# Patient Record
Sex: Male | Born: 1960 | Race: White | Hispanic: No | Marital: Married | Smoking: Never smoker
Health system: Southern US, Community
[De-identification: ages and names within clinical notes are randomized; demographics above are authoritative.]

## PROBLEM LIST (undated history)

## (undated) DIAGNOSIS — E785 Hyperlipidemia, unspecified: Secondary | ICD-10-CM

## (undated) DIAGNOSIS — R000 Tachycardia, unspecified: Secondary | ICD-10-CM

## (undated) DIAGNOSIS — J309 Allergic rhinitis, unspecified: Secondary | ICD-10-CM

## (undated) DIAGNOSIS — K635 Polyp of colon: Secondary | ICD-10-CM

## (undated) DIAGNOSIS — B009 Herpesviral infection, unspecified: Secondary | ICD-10-CM

## (undated) HISTORY — DX: Hyperlipidemia, unspecified: E78.5

## (undated) HISTORY — PX: HERNIA REPAIR: SHX51

## (undated) HISTORY — DX: Tachycardia, unspecified: R00.0

## (undated) HISTORY — PX: SPINE SURGERY: SHX786

## (undated) HISTORY — PX: COLONOSCOPY: SHX174

## (undated) HISTORY — DX: Polyp of colon: K63.5

## (undated) HISTORY — DX: Allergic rhinitis, unspecified: J30.9

## (undated) HISTORY — DX: Herpesviral infection, unspecified: B00.9

---

## 2003-12-09 ENCOUNTER — Ambulatory Visit: Payer: Self-pay | Admitting: Internal Medicine

## 2004-11-26 ENCOUNTER — Ambulatory Visit: Payer: Self-pay | Admitting: Internal Medicine

## 2005-01-03 ENCOUNTER — Ambulatory Visit: Payer: Self-pay | Admitting: Internal Medicine

## 2005-02-01 ENCOUNTER — Ambulatory Visit (HOSPITAL_COMMUNITY): Admission: RE | Admit: 2005-02-01 | Discharge: 2005-02-01 | Payer: Self-pay | Admitting: Surgery

## 2005-02-11 ENCOUNTER — Ambulatory Visit: Payer: Self-pay | Admitting: Internal Medicine

## 2005-12-15 ENCOUNTER — Emergency Department (HOSPITAL_COMMUNITY): Admission: EM | Admit: 2005-12-15 | Discharge: 2005-12-15 | Payer: Self-pay | Admitting: Emergency Medicine

## 2006-02-21 ENCOUNTER — Ambulatory Visit: Payer: Self-pay | Admitting: Internal Medicine

## 2006-04-21 ENCOUNTER — Ambulatory Visit: Payer: Self-pay | Admitting: Internal Medicine

## 2007-03-02 ENCOUNTER — Encounter: Payer: Self-pay | Admitting: Internal Medicine

## 2007-04-22 ENCOUNTER — Encounter: Admission: RE | Admit: 2007-04-22 | Discharge: 2007-04-22 | Payer: Self-pay | Admitting: Internal Medicine

## 2007-04-22 ENCOUNTER — Ambulatory Visit: Payer: Self-pay | Admitting: Internal Medicine

## 2007-04-24 ENCOUNTER — Telehealth (INDEPENDENT_AMBULATORY_CARE_PROVIDER_SITE_OTHER): Payer: Self-pay | Admitting: *Deleted

## 2007-04-28 ENCOUNTER — Encounter: Payer: Self-pay | Admitting: Internal Medicine

## 2007-05-11 ENCOUNTER — Ambulatory Visit (HOSPITAL_COMMUNITY): Admission: RE | Admit: 2007-05-11 | Discharge: 2007-05-12 | Payer: Self-pay | Admitting: Neurosurgery

## 2007-05-14 ENCOUNTER — Encounter: Payer: Self-pay | Admitting: Internal Medicine

## 2007-06-01 ENCOUNTER — Encounter: Payer: Self-pay | Admitting: Internal Medicine

## 2007-07-13 ENCOUNTER — Encounter: Payer: Self-pay | Admitting: Internal Medicine

## 2007-10-13 ENCOUNTER — Encounter: Payer: Self-pay | Admitting: Internal Medicine

## 2007-11-04 ENCOUNTER — Encounter (INDEPENDENT_AMBULATORY_CARE_PROVIDER_SITE_OTHER): Payer: Self-pay

## 2008-01-04 ENCOUNTER — Telehealth: Payer: Self-pay | Admitting: Internal Medicine

## 2008-02-08 ENCOUNTER — Ambulatory Visit: Payer: Self-pay | Admitting: Internal Medicine

## 2008-02-15 ENCOUNTER — Ambulatory Visit: Payer: Self-pay | Admitting: Internal Medicine

## 2008-07-19 ENCOUNTER — Ambulatory Visit: Payer: Self-pay | Admitting: Internal Medicine

## 2008-09-22 ENCOUNTER — Ambulatory Visit: Payer: Self-pay | Admitting: Internal Medicine

## 2008-10-13 ENCOUNTER — Telehealth: Payer: Self-pay | Admitting: Internal Medicine

## 2009-01-26 ENCOUNTER — Ambulatory Visit: Payer: Self-pay | Admitting: Internal Medicine

## 2009-01-26 LAB — CONVERTED CEMR LAB
Albumin: 4.3 g/dL (ref 3.5–5.2)
Alkaline Phosphatase: 57 units/L (ref 39–117)
BUN: 12 mg/dL (ref 6–23)
Basophils Absolute: 0 10*3/uL (ref 0.0–0.1)
CO2: 28 meq/L (ref 19–32)
Calcium: 9.5 mg/dL (ref 8.4–10.5)
Cholesterol: 194 mg/dL (ref 0–200)
Creatinine, Ser: 1 mg/dL (ref 0.4–1.5)
Eosinophils Absolute: 0.1 10*3/uL (ref 0.0–0.7)
GFR calc non Af Amer: 84.27 mL/min (ref >60)
Glucose, Bld: 102 mg/dL — ABNORMAL HIGH (ref 70–99)
HDL: 43.7 mg/dL (ref >39.00)
Hemoglobin: 15.4 g/dL (ref 13.0–17.0)
Lymphocytes Relative: 13.3 % (ref 12.0–46.0)
MCHC: 32.9 g/dL (ref 30.0–36.0)
Monocytes Relative: 7.6 % (ref 3.0–12.0)
Neutro Abs: 4.9 10*3/uL (ref 1.4–7.7)
Platelets: 262 10*3/uL (ref 150.0–400.0)
RDW: 11.9 % (ref 11.5–14.6)
Sodium: 142 meq/L (ref 135–145)
TSH: 1.52 microintl units/mL (ref 0.35–5.50)
Total Bilirubin: 0.9 mg/dL (ref 0.3–1.2)
Triglycerides: 79 mg/dL (ref 0.0–149.0)
VLDL: 15.8 mg/dL (ref 0.0–40.0)

## 2009-01-30 LAB — CONVERTED CEMR LAB

## 2009-02-10 IMAGING — CR DG LUMBAR SPINE 2-3V
1 series · 1 of 1 positions shown · non-contrast
Comparison: MRI 04/22/2007

CLINICAL DATA: Herniated disc.  L3-L4 lumbar laminotomy and
microdiskectomy.

LUMBAR SPINE - 2-3 VIEW

[view not recorded]
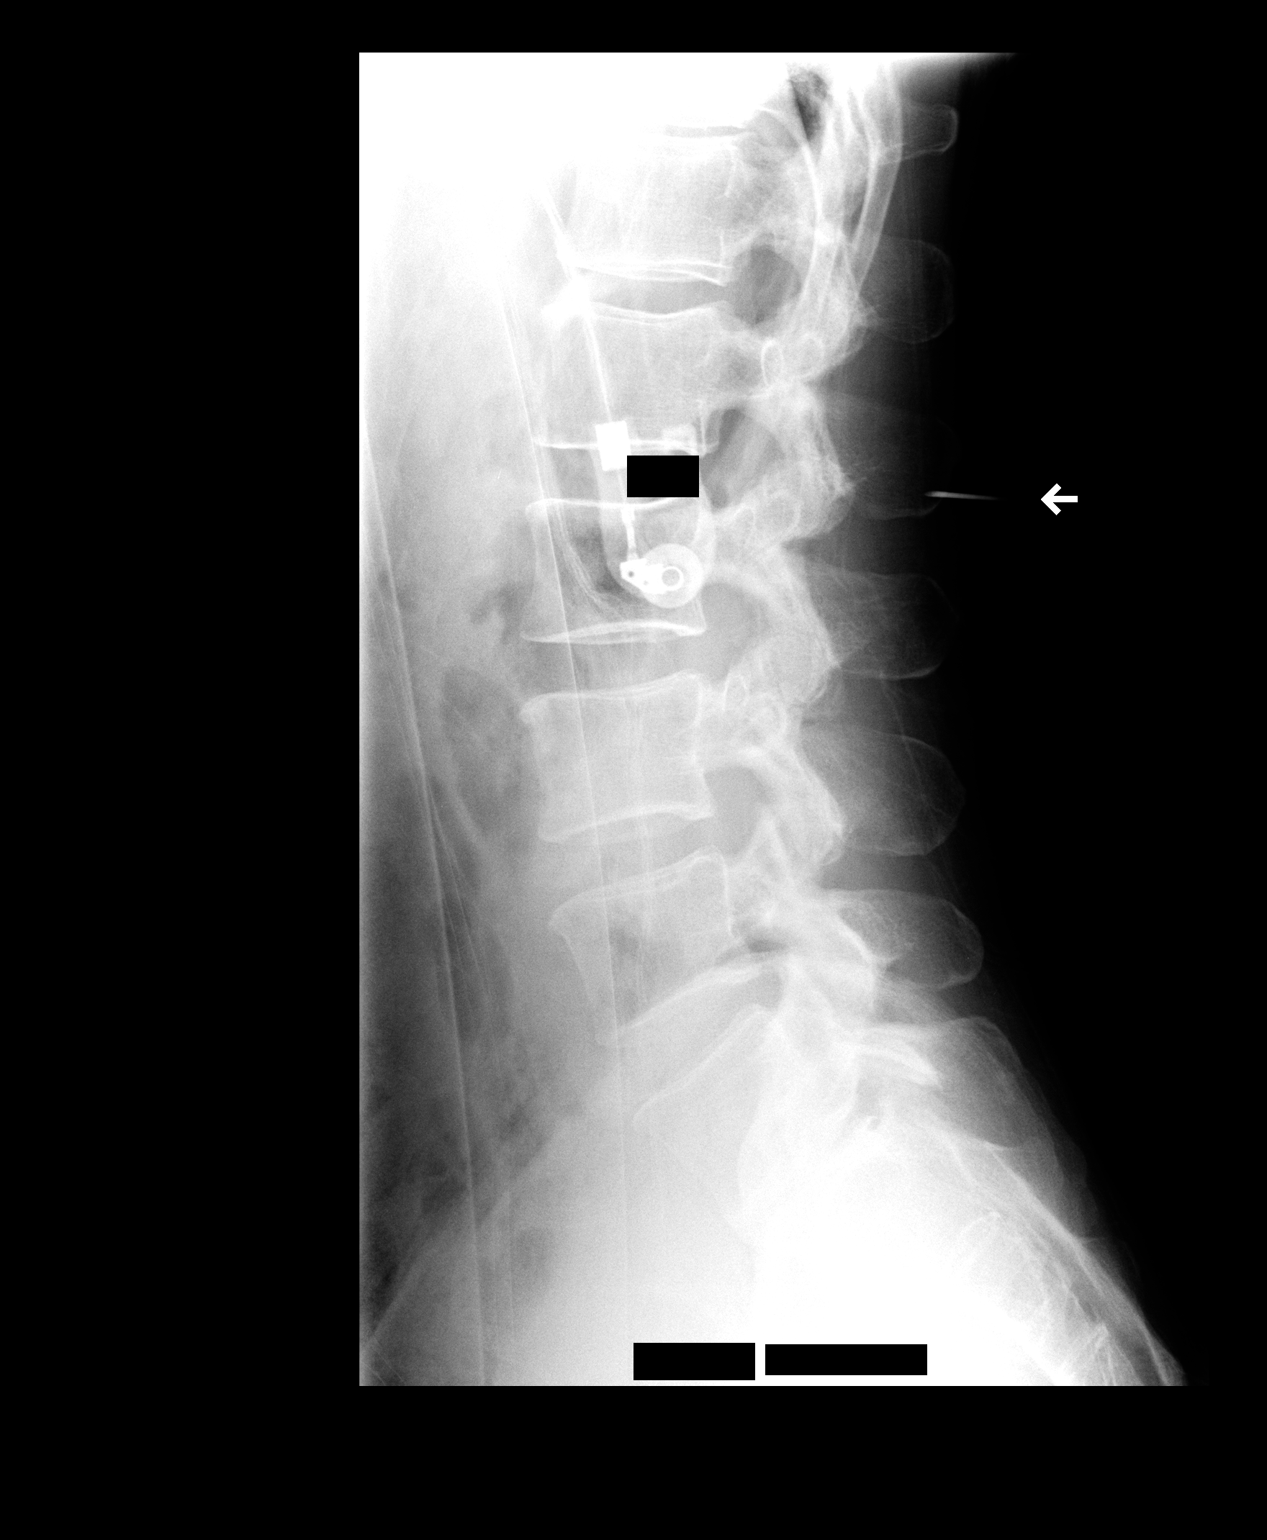

[1 of 1 positions shown; findings below may reference images not displayed]

FINDINGS: Labeling is based on the MRI from 04/22/2007.  There is a
transitional type vertebral body, as demonstrated on that exam.
Image labeled #1 demonstrates a surgical instrument posterior to
the L2-L3.  Image labeled #2 demonstrates a posterior retractor and
surgical instrument posterior to L4.
IMPRESSION: Intraoperative localization.

## 2009-02-21 ENCOUNTER — Ambulatory Visit: Payer: Self-pay | Admitting: Internal Medicine

## 2009-02-21 LAB — CONVERTED CEMR LAB: PSA: 0.41 ng/mL (ref 0.10–4.00)

## 2009-02-24 ENCOUNTER — Ambulatory Visit: Payer: Self-pay | Admitting: Internal Medicine

## 2009-09-05 ENCOUNTER — Ambulatory Visit: Payer: Self-pay | Admitting: Family Medicine

## 2009-09-05 DIAGNOSIS — J019 Acute sinusitis, unspecified: Secondary | ICD-10-CM

## 2009-09-05 DIAGNOSIS — R000 Tachycardia, unspecified: Secondary | ICD-10-CM

## 2009-09-05 DIAGNOSIS — J309 Allergic rhinitis, unspecified: Secondary | ICD-10-CM | POA: Insufficient documentation

## 2009-09-05 DIAGNOSIS — B009 Herpesviral infection, unspecified: Secondary | ICD-10-CM

## 2009-09-05 HISTORY — DX: Herpesviral infection, unspecified: B00.9

## 2009-09-05 HISTORY — DX: Allergic rhinitis, unspecified: J30.9

## 2009-09-05 HISTORY — DX: Tachycardia, unspecified: R00.0

## 2009-12-26 ENCOUNTER — Ambulatory Visit: Payer: Self-pay | Admitting: Internal Medicine

## 2009-12-26 DIAGNOSIS — R21 Rash and other nonspecific skin eruption: Secondary | ICD-10-CM

## 2010-02-09 ENCOUNTER — Encounter: Payer: Self-pay | Admitting: Internal Medicine

## 2010-02-09 ENCOUNTER — Ambulatory Visit
Admission: RE | Admit: 2010-02-09 | Discharge: 2010-02-09 | Payer: Self-pay | Source: Home / Self Care | Attending: Internal Medicine | Admitting: Internal Medicine

## 2010-02-13 LAB — CONVERTED CEMR LAB: GC Probe Amp, Urine: NEGATIVE

## 2010-02-21 NOTE — Assessment & Plan Note (Signed)
Summary: cpx//ccm   Vital Signs:  Patient profile:   50 year old male Height:      68.75 inches Weight:      165 pounds BMI:     24.63 Pulse rate:   70 / minute Resp:     12 per minute BP sitting:   106 / 74  (left arm)  Vitals Entered By: Gladis Riffle, RN (February 24, 2009 10:35 AM) CC: cpx, labs done Is Patient Diabetic? No   CC:  cpx and labs done.  History of Present Illness: cpx  Preventive Screening-Counseling & Management  Alcohol-Tobacco     Smoking Status: never  Caffeine-Diet-Exercise     Does Patient Exercise: yes  Medications Prior to Update: 1)  Flonase 50 Mcg/act  Susp (Fluticasone Propionate) .... As Needed 2)  Zyrtec Allergy 10 Mg  Tabs (Cetirizine Hcl) .... As Needed  Current Medications (verified): 1)  Flonase 50 Mcg/act  Susp (Fluticasone Propionate) .... As Needed 2)  Zyrtec Allergy 10 Mg  Tabs (Cetirizine Hcl) .... As Needed  Allergies (verified): No Known Drug Allergies  Family History: mom-RA father deceased--renal CA age 47 yo  Family History of CAD Male 1st degree relative  age 31 father (CABG)  Social History: Married Never Smoked Alcohol use-yes occasional Regular exercise-yes--minimal Does Patient Exercise:  yes  Review of Systems       All other systems reviewed and were negative    Impression & Recommendations:  Problem # 1:  PREVENTIVE HEALTH CARE (ICD-V70.0) health maint UTD check EKG---(baseline) daily exercise encouraged  Complete Medication List: 1)  Flonase 50 Mcg/act Susp (Fluticasone propionate) .... As needed 2)  Zyrtec Allergy 10 Mg Tabs (Cetirizine hcl) .... As needed 3)  Lunesta 3 Mg Tabs (Eszopiclone) .... One by mouth at bedtime as needed not to exceed 5 monthly Prescriptions: LUNESTA 3 MG TABS (ESZOPICLONE) one by mouth at bedtime as needed not to exceed 5 monthly  #10 x 3   Entered and Authorized by:   Birdie Sons MD   Signed by:   Birdie Sons MD on 02/24/2009   Method used:   Print then Give to  Patient   RxID:   [id number]     Prevention & Chronic Care Immunizations   Influenza vaccine: Not documented   Influenza vaccine deferral: Not available  (02/24/2009)    Tetanus booster: 07/22/1998: Td    Pneumococcal vaccine: Not documented  Other Screening   Smoking status: never  (02/24/2009)  Lipids   Total Cholesterol: 194  (01/26/2009)   LDL: 135  (01/26/2009)   LDL Direct: Not documented   HDL: 43.70  (01/26/2009)   Triglycerides: 79.0  (01/26/2009)  Physical Exam General Appearance: well developed, well nourished, no acute distress Eyes: conjunctiva and lids normal, PERRL, EOMI, Ears, Nose, Mouth, Throat: TM clear, nares clear, oral exam WNL Neck: supple, no lymphadenopathy, no thyromegaly, no JVD Respiratory: clear to auscultation and percussion, respiratory effort normal Cardiovascular: regular rate and rhythm, S1-S2, no murmur, rub or gallop, no bruits, peripheral pulses normal and symmetric, no cyanosis, clubbing, edema or varicosities Chest: no scars, masses, tenderness; no asymmetry, skin changes, nipple discharge, no gynecomastia   Gastrointestinal: soft, non-tender; no hepatosplenomegaly, masses; active bowel sounds all quadrants,  Lymphatic: no cervical, axillary or inguinal adenopathy Musculoskeletal: gait normal, muscle tone and strength WNL, no joint swelling, effusions, discoloration, crepitus  Skin: clear, good turgor, color WNL, no rashes, lesions, or ulcerations Neurologic: normal mental status, normal reflexes, normal strength, sensation, and motion Psychiatric:  alert; oriented to person, place and time Other Exam:

## 2010-02-21 NOTE — Assessment & Plan Note (Signed)
Summary: NUMBNESS IN L HAND // RS   Vital Signs:  Patient profile:   50 year old male Weight:      160 pounds Temp:     98.3 degrees F Pulse rate:   84 / minute Resp:     12 per minute BP sitting:   116 / 84  (left arm)  Vitals Entered By: Gladis Riffle, RN (January 26, 2009 9:39 AM)   History of Present Illness: num b tingling sensation of left hand about 3 weeks ago---sxs resolved spontaneously  prior to that had some swelling of two joints of both hands---swelling---resolved  6 weeks ago had some lesions in and around [facility] treated with doxycycline  concerned with all of the above because of sexual encounter in October  All other systems reviewed and were negative   Preventive Screening-Counseling & Management  Alcohol-Tobacco     Smoking Status: never  Current Problems (verified): None  Current Medications (verified): 1)  Flonase 50 Mcg/act  Susp (Fluticasone Propionate) .... As Needed 2)  Zyrtec Allergy 10 Mg  Tabs (Cetirizine Hcl) .... As Needed  Allergies (verified): No Known Drug Allergies  Comments:  Nurse/Medical Assistant: c/o numbness left hand  The patient's medications and allergies were reviewed with the patient and were updated in the Medication and Allergy Lists. Gladis Riffle, RN (January 26, 2009 9:41 AM)  Review of Systems       All other systems reviewed and were negative    Impression & Recommendations:  Problem # 1:  PREVENTIVE HEALTH CARE (ICD-V70.0)  Orders: Venipuncture (40102) T-HIV-1 (Screen) [phone]) T-RPR (Syphilis) (64403-47425) TLB-Lipid Panel (80061-LIPID) TLB-BMP (Basic Metabolic Panel-BMET) (80048-METABOL) TLB-CBC Platelet - w/Differential (85025-CBCD) TLB-Hepatic/Liver Function Pnl (80076-HEPATIC) TLB-TSH (Thyroid Stimulating Hormone) (84443-TSH) TLB-Sedimentation Rate (ESR) (85652-ESR)   discussed sexual encounter at length needs STD evaluation discussed safe sex  Complete Medication List: 1)  Flonase 50  Mcg/act Susp (Fluticasone propionate) .... As needed 2)  Zyrtec Allergy 10 Mg Tabs (Cetirizine hcl) .... As needed

## 2010-02-21 NOTE — Assessment & Plan Note (Signed)
Summary: COLD/HA/CHEST CONGESTION/FEVER/NJR   Vital Signs:  Patient profile:   50 year old male Height:      68.75 inches (174.63 cm) Weight:      171 pounds (77.73 kg) O2 Sat:      99 % on Room air Temp:     98.3 degrees F (36.83 degrees C) oral Pulse rate:   100 / minute BP sitting:   122 / 82  (left arm) Cuff size:   regular  Vitals Entered By: Josph Macho RMA (September 05, 2009 2:35 PM)  O2 Flow:  Room air  Serial Vital Signs/Assessments:  Time      Position  BP       Pulse  Resp  Temp     By                              102                   Danise Edge MD  CC: Cold, headache, chest congestion, fever X  days/ CF Is Patient Diabetic? No   History of Present Illness: Patient in today with 4-5 day history of worsening URI symptoms. His symptoms started with mild nasal congestion and sore throat last week. Over the next several days hsi sore throat worsened and now his nasal congestion is causing significant facial pressure/ ear pain and irritation/green rhinorrhea/sneezing/malaise/mild cough. Cough is nonproductive and he does not feel tight or congested in his chest. He denies any CP/palp/SOB/GI c/o. He has a history of a deviated septum and sinusitis and gets about a sinus infection a year. No recent allergic symptoms so he had not taken his Flonase or Zyrtec. He has been taking some Sudafed and Aleve to help with congestion and fevers. He reports low grade fevers over past 24 hours  Current Medications (verified): 1)  Flonase 50 Mcg/act  Susp (Fluticasone Propionate) .... As Needed 2)  Zyrtec Allergy 10 Mg  Tabs (Cetirizine Hcl) .... As Needed 3)  Lunesta 3 Mg Tabs (Eszopiclone) .... One By Mouth At Bedtime As Needed Not To Exceed 5 Monthly 4)  Sudafed .... 2 in Am 5)  Aleve .Marland Kitchen.. 2 in Morning  Allergies (verified): No Known Drug Allergies  Past History:  Past medical history reviewed for relevance to current acute and chronic problems. Social history (including risk  factors) reviewed for relevance to current acute and chronic problems.  Past Medical History: Reviewed history from 09/30/2006 and no changes required. Allergic rhinitis, seasonal  Social History: Reviewed history from 02/24/2009 and no changes required. Married Never Smoked Alcohol use-yes occasional Regular exercise-yes--minimal  Review of Systems      See HPI  Physical Exam  General:  Well-developed,well-nourished,in no acute distress; alert,appropriate and cooperative throughout examination Head:  Normocephalic and atraumatic without obvious abnormalities. No apparent alopecia or balding. Ears:  slight clear fluid behind b/l TMs, mild erythema noted on L TM, R TM clear Nose:  Nasal mucosa boggy and erythematous, purulent discharge noted Mouth:  posterior oropharynx erythematous but not edematous and no exudate. Small vesicular lesion on lower lip externally Neck:  No deformities, masses, or tenderness noted. Lungs:  Normal respiratory effort, chest expands symmetrically. Lungs are clear to auscultation, no crackles or wheezes. Heart:  Normal regular rhythm. S1 and S2 normal without gallop, murmur, click, rub or other extra sounds. Abdomen:  Bowel sounds positive,abdomen soft and non-tender without masses, organomegaly or hernias noted. Skin:  Intact without suspicious lesions or rashes Psych:  Cognition and judgment appear intact. Alert and cooperative with normal attention span and concentration. No apparent delusions, illusions, hallucinations   Impression & Recommendations:  Problem # 1:  ACUTE SINUSITIS, UNSPECIFIED (ICD-461.9)  His updated medication list for this problem includes:    Flonase 50 Mcg/act Susp (Fluticasone propionate) .Marland Kitchen... As needed    Cipro 500 Mg Tabs (Ciprofloxacin hcl) .Marland Kitchen... 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 As needed    Zyrtec Allergy 10 Mg Tabs (Cetirizine hcl) .Marland Kitchen... As needed Restart meds and add saline nasal flushes daily  Problem # 5:  UNSPECIFIED TACHYCARDIA (ICD-785.0) Likely due to low grade fever and sudafed use, encouraged d/c Sudafed and increase hydration  Complete Medication List: 1)  Flonase 50 Mcg/act Susp (Fluticasone propionate) .... As needed 2)  Zyrtec Allergy 10 Mg Tabs (Cetirizine hcl) .... As needed 3)  Lunesta 3 Mg Tabs (Eszopiclone) .... One by mouth at bedtime as needed not to exceed 5 monthly 4)  Sudafed  .... 2 in am 5)  Cipro 500 Mg Tabs (Ciprofloxacin hcl) .Marland Kitchen.. 1 tab by mouth two times a day x 10 days 6)  Naprosyn 500 Mg Tabs (Naproxen) .Marland Kitchen.. 1 tab by mouth two times a day as needed pain with food 7)  Zovirax 5 % Crea (Acyclovir) .... Apply to lesions 5 x daily x 4 days as needed lesions  Patient Instructions: 1)  Please schedule a follow-up appointment as needed .  2)  Take your antibiotic as prescribed until ALL of it is gone, but stop if you develop a rash or swelling and contact our office as soon as possible.  3)  Acute sinusitis symptoms for less than 10 days are not helped by antibiotics. Use warm moist compresses, and over the counter decongestants( only as directed). Call if no improvement in 5-7 days, sooner if increasing pain, fever, or new symptoms.  4)  Add OTC Mucinex two times a day x 10 days while on Ciprofloxacin (the antibiotic) 5)  Consider a yogurt or probiotic daily during any month you take an antibiotic Prescriptions: ZOVIRAX 5 % CREA (ACYCLOVIR) apply to lesions 5 x daily x 4 days as needed lesions  #5 gm tube x 1   Entered and Authorized by:   Danise Edge MD   Signed by:   Danise Edge MD on 09/05/2009   Method used:   Electronically to        [facility] [address] [phone]*  (retail)       [address]       [place], [place]  [address]       Ph: [phone] or [phone]       Fax: [phone]   RxID:   [id number] NAPROSYN 500 MG TABS (NAPROXEN) 1 tab by mouth two times a day as needed pain with food  #60 x 1   Entered and Authorized by:   Danise Edge MD   Signed by:   Danise Edge MD on 09/05/2009   Method used:   [facility]        [facility] [id number]* (retail)       [address]       [place], [place]  [address]       Ph: [phone] or [phone]       Fax: [phone]   RxID:   [id number] CIPRO 500 MG TABS (CIPROFLOXACIN HCL) 1 tab by mouth two times a day x 10 days  #20 x 0   Entered and Authorized by:   Danise Edge MD   Signed by:   Danise Edge MD on 09/05/2009   Method used:   [facility]        [facility] [address] [phone]* (retail)       [address]       [place], [place]  [address]       Ph: [phone] or [phone]       Fax: [phone]   RxID:   [id number]

## 2010-02-21 NOTE — Miscellaneous (Signed)
Summary: Informed Consent For Antibody Testing  Informed Consent For Antibody Testing   Imported By: Maryln Gottron 01/31/2009 10:31:56  _____________________________________________________________________  External Attachment:    Type:   Image     Comment:   External Document

## 2010-02-22 NOTE — Assessment & Plan Note (Signed)
Summary: EVAL OF SORE // RS   Vital Signs:  Patient profile:   50 year old male Height:      68.75 inches Weight:      186 pounds Temp:     98.6 degrees F oral Pulse rate:   82 / minute BP sitting:   118 / 78  (left arm)  Vitals Entered By: Jeremy Johann CMA (December 26, 2009 9:31 AM) CC: sores on lip, arms and lower abdomen xmonths   CC:  sores on lip and arms and lower abdomen xmonths.  History of Present Illness: long term lesion on lip---now aching more---seems to have turned into a scab.  He also notes some lesions on arms and trunk---areas of erythma (small)  patient is concerned that some of these unusual complaints good related to section transmitted disease. Vision has had some homosexual exposure.  Patient denies any other complaints in a complete review of systems.  Current Medications (verified): 1)  Sudafed .... 2 in Am 2)  Zovirax 5 % Crea (Acyclovir) .... Apply To Lesions 5 X Daily X 4 Days As Needed Lesions  Allergies (verified): No Known Drug Allergies  Past History:  Past Medical History: Last updated: 09/30/2006 Allergic rhinitis, seasonal  Family History: Last updated: 03-19-2009 mom-RA father deceased--renal CA age 38 yo  Family History of CAD Male 1st degree relative  age 21 father (CABG)  Social History: Last updated: 03-19-09 Married Never Smoked Alcohol use-yes occasional Regular exercise-yes--minimal  Risk Factors: Exercise: yes (19-Mar-2009)  Risk Factors: Smoking Status: never (March 19, 2009)  Physical Exam  General:  well-developed well-nourished male in no acute distress. HEENT exam atraumatic, normocephalic, neck supple. Or paroxysmal is without erythema or exudate. Neck is supple. Chest clear to auscultation cardiac exam S1-S2 are regular. Abdominal exam thin, to bowel sounds, soft ready to is no clubbing cyanosis or edema. Neurologic exam no significant rashes.   Impression & Recommendations:  Problem # 1:   RASH-NONVESICULAR (ICD-782.1) I had a long discussion with patient regarding his complaints. He is concerned with a history of homosexual exposure. He would like STD testing. We discussed that. He like to be this in a more confidential weight. I've given him alternatives to laboratory testing. He will inform me of any abnormalities. Total time 25 minutes with the patient more than half face-to-face discussed STD counseling.  Complete Medication List: 1)  Sudafed  .... 2 in am 2)  Zovirax 5 % Crea (Acyclovir) .... Apply to lesions 5 x daily x 4 days as needed lesions   Orders Added: 1)  Est. Patient Level IV [[address]]

## 2010-02-28 NOTE — Assessment & Plan Note (Signed)
Summary: check bump on mouth/cjr/pt rsc/cjr   Vital Signs:  Patient profile:   50 year old male Weight:      168 pounds BMI:     25.08 Temp:     98.2 degrees F oral BP sitting:   132 / 92  (left arm) Cuff size:   regular  Vitals Entered By: Alfred Levins, CMA (February 09, 2010 9:32 AM) CC: cold sore   CC:  cold sore.  History of Present Illness: recurrent scab/lesion left lower lip no bleeding...but recurent scab  All other systems reviewed and were negative except full sensation behind left ear  Current Medications (verified): 1)  None  Allergies (verified): No Known Drug Allergies  Past History:  Past Medical History: Last updated: 09/30/2006 Allergic rhinitis, seasonal  Family History: Last updated: 03-26-2009 mom-RA father deceased--renal CA age 79 yo  Family History of CAD Male 1st degree relative  age 53 father (CABG)  Social History: Last updated: 03-26-2009 Married Never Smoked Alcohol use-yes occasional Regular exercise-yes--minimal  Risk Factors: Exercise: yes (03/26/2009)  Risk Factors: Smoking Status: never (2009-03-26)  Physical Exam  General:   has a hypertrophic lesion left lower lip.   Impression & Recommendations:  Problem # 1:  RASH-NONVESICULAR (ICD-782.1)  he has an unusual lesion is recurrent. Needs further evaluation. Refer to dermatology. Orders: Dermatology Referral (Derma)  Other Orders: Venipuncture ([address]) Specimen Handling ([address]) T-HIV Antibody  (Reflex) [phone]) T-RPR (Syphilis) [phone]) T- * Misc. Laboratory test ([phone])   Orders Added: 1)  Est. Patient Level III [96[address]] 2)  Dermatology Referral [Derma] 3)  Venipuncture [28413] 4)  Specimen Handling [99000] 5)  T-HIV Antibody  (Reflex) [[id number]] 6)  T-RPR (Syphilis) [36644-03474] 7)  T- * Misc. Laboratory test [phone]

## 2010-06-05 NOTE — Op Note (Signed)
Antonio Valdez, HELLEN NO.:  [id number]   MEDICAL RECORD NO.:  [id number]          PATIENT TYPE:  OIB   LOCATION:  3526                         FACILITY:  [facility]   PHYSICIAN:  Hewitt Shorts, M.D.DATE OF BIRTH:  03/27/1960   DATE OF PROCEDURE:  05/11/2007  DATE OF DISCHARGE:                               OPERATIVE REPORT   PREOPERATIVE DIAGNOSES:  1. Left L3-L4 lumbar disc herniation.  2. Lumbar degenerative disc disease.  3. Lumbar spondylosis.  4. Lumbar radiculopathy.   POSTOPERATIVE DIAGNOSES:  1. Left L3-L4 lumbar disc herniation.  2. Lumbar degenerative disc disease.  3. Lumbar spondylosis.  4. Lumbar radiculopathy.   PROCEDURE:  Left L3-L4 lumbar laminotomy and microdiscectomy with  microdissection.   SURGEON:  Hewitt Shorts, M.D.   ASSISTANT:  Danae Orleans. Venetia Maxon, M.D.   ANESTHESIA:  General endotracheal.   INDICATIONS FOR PROCEDURE:  The patient is a 50 year old man who  presented with left lumbar radiculopathy which was found to be due to a  left L3-L4 lumbar disc herniation with a fragment that had migrated  caudally behind the body of L4 extending into the left L4 nerve root  foramen.  Decision was made to proceed with elective laminotomy and  microdiscectomy.   DESCRIPTION OF PROCEDURE:  The patient was brought to the operating room  and placed under general endotracheal anesthesia.  The patient was  turned to a prone position.  Lumbar region was prepped with Betadine  soap and solution and draped in a sterile fashion.  The midline was  infiltrated with local anesthetic with epinephrine and x-ray was taken.  The L3-L4 level was identified and midline incision was made over the L3-  L4 level and carried down through the subcutaneous tissue.  Bipolar  electrocautery was used to maintain hemostasis.  Dissection was carried  down to the lumbar fascia, which was incised on the left side of the  midline.  The paraspinal muscles were  dissected from the spinous process  and lamina in a subperiosteal fashion.  The L3-L4 intralaminar space was  identified.  The x-ray was taken and confirmed the localization.  Then  the microscope was draped and brought to the field to provide additional  navigation, illumination, and visualization, and the remainder of the  decompression was performed using microdissection and microsurgical  technique.   Laminotomy was performed using the X-Max drill and Kerrison punches.  The ligamentum flavum was carefully removed, and we identified the  thecal sac on exiting left L4 nerve root.  These structures were gently  retracted medially and the disc herniation identified.  There was free  fragment compressing the left L4 nerve root as it entered into the L4  nerve root foramen.  It was removed in its entirety and it was fully  extruded and no rent in the anulus was found, and therefore the disc  space was not entered.  However, the entire flavum was removed and good  decompression of the nerve root was achieved.  The wound was irrigated  with Bacitracin solution and checked for hemostasis, which was  established  with the use of Gelfoam soaks and thrombin.  The Gelfoam was  removed prior to closure and hemostasis was reconfirmed.  We then  instilled 2 mL of fentanyl and 80 mg of Depo-Medrol into the epidural  space and then proceeded with closure.  The deep fascia was closed with  interrupted undyed #1 Vicryl sutures.  Subcutaneous and subcuticular  were closed with interrupted inverted 2-0 undyed Vicryl sutures.  The  skin was reapproximated with Dermabond.  The procedure was tolerated  well.  The estimated blood loss was 25 mL.  Sponge count was correct.  Following surgery, the patient was returned back to the supine position,  to be reversed from the anesthetic, extubated, and transferred to the  recovery room for further care.      Hewitt Shorts, M.D.  Electronically  Signed     RWN/MEDQ  D:  05/11/2007  T:  05/12/2007  Job:  [id number]

## 2010-06-08 NOTE — Op Note (Signed)
NAMETALON, REGALA                ACCOUNT NO.:  [id number]   MEDICAL RECORD NO.:  [id number]          PATIENT TYPE:  AMB   LOCATION:  DAY                          FACILITY:  [facility]   PHYSICIAN:  Sandria Bales. Ezzard Standing, M.D.  DATE OF BIRTH:  07-14-1960   DATE OF PROCEDURE:  02/01/2005  DATE OF DISCHARGE:                                 OPERATIVE REPORT   PREOPERATIVE DIAGNOSIS:  Left inguinal hernia.   POSTOPERATIVE DIAGNOSIS:  Medium-sized indirect left inguinal hernia.   PROCEDURE:  Laparoscopic left inguinal hernia repair with pre-cut Atrium  mesh.   SURGEON:  Sandria Bales. Ezzard Standing, M.D.   ANESTHESIA:  General with approximately 29 cc of 0.25% Marcaine.   COMPLICATIONS:  None.   INDICATIONS FOR PROCEDURE:  Mr. Antonio Valdez is a 50 year old white male who has  had a symptomatic left inguinal hernia and now comes for repair for this  hernia.  Discussed with him about the open and laparoscopic approaches.  I  discussed the potential complications, which includes. but is not limited  to, bleeding, infection, nerve injury, and recurrence of the hernia.  He  wanted to go with a laparoscopic hernia repair.   OPERATIVE NOTE:  The patient was marked in the holding area, given 1 gm of  Ancef, and taken to the operating room.  He underwent a general anesthesia.  Foley catheter was placed without difficulty.  His abdomen was prepped with  Betadine solution and sterilely draped.   An infraumbilical incision was made with sharp dissection carried down to  the rectus abdominis fascia.  I then made an incision on the left side to  the anterior rectus on the left side and retracted the rectus abdominis  muscle.  I used the [employer] preperitoneal balloon dissector to dilate  the pre-peritoneal space under direct laparoscopic visualization.  I then  removed the balloon and inserted two 5 mm trocars in the lower abdomen.  I  identified the pubic tubercle, Cooper's ligament, the cord structures, and  vas  deferens.  The patient had an indirect inguinal hernia which tracked up  along the cord structures.  I teased this sac down.  This sac was probably  about 6-7 cm in length.  I placed an endo loop suture along the base of the  sac.   I then dissected off the inguinal floor to make sure there was no direct  inguinal hernia, which I did not see, and I then put a piece of Atrium mesh,  which was precut and put this around the cord structures.  I stapled it  medially to the pubic tubercle, inferiorly to Cooper's ligament, superiorly  to the transversalis fascia.  I kept my staples anterior to the iliopubic  tract out lateral to the cord structures.   I used a total of 12 tacks to plate the mesh and place this mesh plate flat.  I took pictures of this what I placed in the chart.   The patient tolerated the procedure well.  I then removed these trocars.  I  placed a 0 Vicryl suture at the anterior rectus  fascia and a 5-0 Vicryl  suture in the skin, in both the lower abdomen and the infraumbilical skin.  I painted the wound with tincture of Benzoin and steri-stripped it.   The patient tolerated the procedure well and was transferred to the recovery  room in good condition.  Sponge and needle counts were correct at the end of  the case.      Sandria Bales. Ezzard Standing, M.D.  Electronically Signed     DHN/MEDQ  D:  02/01/2005  T:  02/01/2005  Job:  [id number]   cc:   Valetta Mole. Swords, M.D. [facility]  [address] [place]  [place] [address]

## 2010-10-16 LAB — CBC
HCT: 43.8
Hemoglobin: 15.1
MCHC: 34.4
MCV: 92.1
Platelets: 260
RBC: 4.76
RDW: 12.9
WBC: 8.1

## 2010-10-30 ENCOUNTER — Ambulatory Visit: Payer: Self-pay | Admitting: Internal Medicine

## 2010-10-30 ENCOUNTER — Encounter: Payer: Self-pay | Admitting: Internal Medicine

## 2010-10-30 ENCOUNTER — Ambulatory Visit (INDEPENDENT_AMBULATORY_CARE_PROVIDER_SITE_OTHER): Payer: BC Managed Care – PPO | Admitting: Internal Medicine

## 2010-10-30 VITALS — BP 110/68 | Temp 97.7°F | Ht 70.0 in | Wt 163.0 lb

## 2010-10-30 DIAGNOSIS — G5620 Lesion of ulnar nerve, unspecified upper limb: Secondary | ICD-10-CM

## 2010-10-30 NOTE — Progress Notes (Signed)
  Subjective:    Patient ID: Antonio Valdez, male    DOB: 03-27-1960, 50 y.o.   MRN: [id number]  HPI  50 year old patient who presents with a two-week history of mild numbness involving his left fourth and fifth fingers and also the ulnar aspect of his palm. About this time he was at the gym  and was doing some vigorous exercises. He denies any motor weakness and has had no progression of his symptoms. He denies any neck pain shoulder pain or other arm pain or paresthesias.  The mild numbness is described as constant without alleviating factors    Review of Systems  Constitutional: Negative for fever, chills, appetite change and fatigue.  HENT: Negative for hearing loss, ear pain, congestion, sore throat, trouble swallowing, neck stiffness, dental problem, voice change and tinnitus.   Eyes: Negative for pain, discharge and visual disturbance.  Respiratory: Negative for cough, chest tightness, wheezing and stridor.   Cardiovascular: Negative for chest pain, palpitations and leg swelling.  Gastrointestinal: Negative for nausea, vomiting, abdominal pain, diarrhea, constipation, blood in stool and abdominal distention.  Genitourinary: Negative for urgency, hematuria, flank pain, discharge, difficulty urinating and genital sores.  Musculoskeletal: Negative for myalgias, back pain, joint swelling, arthralgias and gait problem.  Skin: Negative for rash.  Neurological: Positive for numbness. Negative for dizziness, syncope, speech difficulty, weakness and headaches.  Hematological: Negative for adenopathy. Does not bruise/bleed easily.  Psychiatric/Behavioral: Negative for behavioral problems and dysphoric mood. The patient is not nervous/anxious.        Objective:   Physical Exam  Constitutional: He appears well-developed and well-nourished. No distress.  Neck: Normal range of motion. No thyromegaly present.  Musculoskeletal:       No tenderness about the ulnar notch. Range of motion of the neck  and shoulder intact  Neurological:       Triceps and biceps reflexes were normal. Grip strength was normal;  there was no obvious anesthesia demonstrated with soft touch to her monofilament testing  over the affected distribution.           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 Suspect mild traumatic  injury at the level of the ulnar notch related to over vigorous activity at the gym;  he was reassured. He was told that the symptoms may take weeks to resolve. He was also told that he should not have any worsening symptoms and if this were to occur to recontact the office

## 2010-10-30 NOTE — Patient Instructions (Signed)
Call or return to clinic prn if these symptoms worsen or fail to improve as anticipated.

## 2011-01-08 ENCOUNTER — Ambulatory Visit (INDEPENDENT_AMBULATORY_CARE_PROVIDER_SITE_OTHER)
Admission: RE | Admit: 2011-01-08 | Discharge: 2011-01-08 | Disposition: A | Discharge status: Home / Self Care | Payer: BC Managed Care – PPO | Source: Ambulatory Visit | Attending: Internal Medicine | Admitting: Internal Medicine

## 2011-01-08 ENCOUNTER — Ambulatory Visit (INDEPENDENT_AMBULATORY_CARE_PROVIDER_SITE_OTHER): Payer: BC Managed Care – PPO | Admitting: Internal Medicine

## 2011-01-08 ENCOUNTER — Encounter: Payer: Self-pay | Admitting: Internal Medicine

## 2011-01-08 VITALS — BP 110/70 | Temp 98.4°F | Wt 157.0 lb

## 2011-01-08 DIAGNOSIS — Q766 Other congenital malformations of ribs: Secondary | ICD-10-CM

## 2011-01-08 DIAGNOSIS — Q678 Other congenital deformities of chest: Secondary | ICD-10-CM

## 2011-01-08 DIAGNOSIS — Q767 Congenital malformation of sternum: Secondary | ICD-10-CM

## 2011-01-08 NOTE — Patient Instructions (Signed)
X-ray evaluation as discussed  Call or return to clinic prn if these symptoms worsen or fail to improve as anticipated.

## 2011-01-08 NOTE — Progress Notes (Signed)
Quick Note:  Attempt to call- VM- LMTCB if questions- gave instructions ______

## 2011-01-08 NOTE — Progress Notes (Signed)
  Subjective:    Patient ID: Antonio Valdez, male    DOB: [birth year], 50 y.o.   MRN: [id number]  HPI  50 year old patient who is concerned about a mass effect in the right anterior lower chest wall area. He did have a chest x-ray performed in 2007 that revealed no osseous abnormality;  he states that recently he has noted a prominence in the right anterior lower chest wall region. He denies any pain or tenderness and denies any prior history of rib fracture. There's been no recent change in weight. Denies any constitutional complaints  Review of Systems  Constitutional: Negative for fever, chills, appetite change and fatigue.  HENT: Negative for hearing loss, ear pain, congestion, sore throat, trouble swallowing, neck stiffness, dental problem, voice change and tinnitus.   Eyes: Negative for pain, discharge and visual disturbance.  Respiratory: Negative for cough, chest tightness, wheezing and stridor.   Cardiovascular: Negative for chest pain, palpitations and leg swelling.  Gastrointestinal: Negative for nausea, vomiting, abdominal pain, diarrhea, constipation, blood in stool and abdominal distention.  Genitourinary: Negative for urgency, hematuria, flank pain, discharge, difficulty urinating and genital sores.  Musculoskeletal: Negative for myalgias, back pain, joint swelling, arthralgias and gait problem.  Skin: Negative for rash.  Neurological: Negative for dizziness, syncope, speech difficulty, weakness, numbness and headaches.  Hematological: Negative for adenopathy. Does not bruise/bleed easily.  Psychiatric/Behavioral: Negative for behavioral problems and dysphoric mood. The patient is not nervous/anxious.        Objective:   Physical Exam  Constitutional: He appears well-developed and well-nourished. No distress.  Pulmonary/Chest: Effort normal and breath sounds normal. He exhibits no tenderness.       There was asymmetry of the anterior lower chest wall on the right with a  prominent bony enlargement over the mid right costal margin. This area was nontender  Abdominal: Soft. Bowel sounds are normal. He exhibits no distension. There is no tenderness. There is no rebound.          Assessment & Plan:   Asymmetry of the right chest wall. Doubt significant pathologic. We'll obtain a PA and lateral chest x-ray to rule out a osseous abnormality

## 2011-01-23 ENCOUNTER — Telehealth: Payer: Self-pay

## 2011-01-23 NOTE — Telephone Encounter (Signed)
Pt called and states he accidentally erased the message about his chest x-ray. Pt given instructions per Dr. Amador Cunas.  Pt states if any other symptoms appear he will contact office for a f/u appt.

## 2011-01-30 ENCOUNTER — Telehealth: Payer: Self-pay | Admitting: *Deleted

## 2011-01-30 NOTE — Telephone Encounter (Signed)
Opened in error

## 2011-02-04 ENCOUNTER — Encounter: Payer: Self-pay | Admitting: Internal Medicine

## 2011-02-04 ENCOUNTER — Ambulatory Visit (INDEPENDENT_AMBULATORY_CARE_PROVIDER_SITE_OTHER): Payer: BC Managed Care – PPO | Admitting: Internal Medicine

## 2011-02-04 VITALS — BP 120/72 | Temp 97.9°F | Wt 159.0 lb

## 2011-02-04 DIAGNOSIS — M954 Acquired deformity of chest and rib: Secondary | ICD-10-CM

## 2011-02-04 NOTE — Progress Notes (Signed)
  Subjective:    Patient ID: Antonio Valdez, male    DOB: Dec 03, 1960, 51 y.o.   MRN: [id number]  HPI  51 year old patient who was seen last month due today and had a mild CVA in the right lower anterior chest wall region. He had noted this area only approximately one week prior. There is been no prior rib fractures or history of trauma. He is quite thin and there's been no recent weight loss. A chest x-ray was obtained that revealed no osseous abnormality. The patient denies quite concerned about an abnormality in this area    Review of Systems  Respiratory: Negative.        Objective:   Physical Exam  Constitutional: He appears well-developed and well-nourished.  Pulmonary/Chest: He exhibits no tenderness.       The patient's right lower chest wall area is much more prominent compared to the left          Assessment & Plan:   Chest wall abnormality.  The asymmetry is not very subtle and it is unclear why the patient has not noted this except over the past month. Chest x-ray reveals no osseous abnormality. The patient is quite concerned about serious pathology. We'll obtain an MRI of the right chest wall to exclude significant pathology

## 2011-02-04 NOTE — Patient Instructions (Signed)
Chest MRI as discussed Call if any Clinical change

## 2011-02-08 ENCOUNTER — Other Ambulatory Visit: Payer: BC Managed Care – PPO

## 2011-02-11 ENCOUNTER — Ambulatory Visit
Admission: RE | Admit: 2011-02-11 | Discharge: 2011-02-11 | Disposition: A | Discharge status: Home / Self Care | Payer: BC Managed Care – PPO | Source: Ambulatory Visit | Attending: Internal Medicine | Admitting: Internal Medicine

## 2011-02-11 DIAGNOSIS — M954 Acquired deformity of chest and rib: Secondary | ICD-10-CM

## 2011-02-18 ENCOUNTER — Encounter: Payer: BC Managed Care – PPO | Admitting: Internal Medicine

## 2011-03-01 ENCOUNTER — Other Ambulatory Visit (INDEPENDENT_AMBULATORY_CARE_PROVIDER_SITE_OTHER): Payer: BC Managed Care – PPO

## 2011-03-01 DIAGNOSIS — Z0000 Encounter for general adult medical examination without abnormal findings: Secondary | ICD-10-CM

## 2011-03-01 LAB — TESTOSTERONE: Testosterone: 312.85 ng/dL — ABNORMAL LOW (ref 350.00–890.00)

## 2011-03-01 LAB — HEPATIC FUNCTION PANEL
Albumin: 4 g/dL (ref 3.5–5.2)
Alkaline Phosphatase: 59 U/L (ref 39–117)
Bilirubin, Direct: 0.1 mg/dL (ref 0.0–0.3)
Total Bilirubin: 0.9 mg/dL (ref 0.3–1.2)

## 2011-03-01 LAB — BASIC METABOLIC PANEL
CO2: 30 mEq/L (ref 19–32)
Calcium: 9.2 mg/dL (ref 8.4–10.5)
Creatinine, Ser: 0.9 mg/dL (ref 0.4–1.5)
Glucose, Bld: 93 mg/dL (ref 70–99)

## 2011-03-01 LAB — POCT URINALYSIS DIPSTICK
Bilirubin, UA: NEGATIVE
Blood, UA: NEGATIVE
Glucose, UA: NEGATIVE
Nitrite, UA: NEGATIVE
Spec Grav, UA: 1.015
Urobilinogen, UA: 0.2

## 2011-03-01 LAB — PSA: PSA: 0.34 ng/mL (ref 0.10–4.00)

## 2011-03-01 LAB — CBC WITH DIFFERENTIAL/PLATELET
Basophils Absolute: 0 10*3/uL (ref 0.0–0.1)
Eosinophils Absolute: 0.1 10*3/uL (ref 0.0–0.7)
Lymphocytes Relative: 24.5 % (ref 12.0–46.0)
MCHC: 34 g/dL (ref 30.0–36.0)
Neutro Abs: 3 10*3/uL (ref 1.4–7.7)
Neutrophils Relative %: 63.7 % (ref 43.0–77.0)
Platelets: 226 10*3/uL (ref 150.0–400.0)
RDW: 13.2 % (ref 11.5–14.6)

## 2011-03-01 LAB — TSH: TSH: 1.56 u[IU]/mL (ref 0.35–5.50)

## 2011-03-01 LAB — LIPID PANEL
HDL: 53.3 mg/dL (ref >39.00)
VLDL: 11.4 mg/dL (ref 0.0–40.0)

## 2011-03-08 ENCOUNTER — Ambulatory Visit (INDEPENDENT_AMBULATORY_CARE_PROVIDER_SITE_OTHER): Payer: BC Managed Care – PPO | Admitting: Internal Medicine

## 2011-03-08 ENCOUNTER — Encounter: Payer: Self-pay | Admitting: Internal Medicine

## 2011-03-08 VITALS — BP 110/76 | Temp 98.3°F | Ht 68.3 in | Wt 158.0 lb

## 2011-03-08 DIAGNOSIS — Z0000 Encounter for general adult medical examination without abnormal findings: Secondary | ICD-10-CM

## 2011-03-08 NOTE — Progress Notes (Signed)
Patient ID: Antonio Valdez, male   DOB: 1960-10-16, 51 y.o.   MRN: [id number] cpx  Past Medical History  Diagnosis Date  . ALLERGIC RHINITIS CAUSE UNSPECIFIED 09/05/2009  . HERPES LABIALIS 09/05/2009  . UNSPECIFIED TACHYCARDIA 09/05/2009    History   Social History  . Marital Status: Married    Spouse Name: N/A    Number of Children: N/A  . Years of Education: N/A   Occupational History  . Not on file.   Social History Main Topics  . Smoking status: Never Smoker   . Smokeless tobacco: Never Used  . Alcohol Use: Yes  . Drug Use: No  . Sexually Active: Not on file   Other Topics Concern  . Not on file   Social History Narrative  . No narrative on file    No past surgical history on file.  Family History  Problem Relation Age of Onset  . Arthritis Mother   . Cancer Father     renal ca    No Known Allergies  No current outpatient prescriptions on file prior to visit.     patient denies chest pain, shortness of breath, orthopnea. Denies lower extremity edema, abdominal pain, change in appetite, change in bowel movements. Patient denies rashes, musculoskeletal complaints. No other specific complaints in a complete review of systems.   BP 110/76  Temp(Src) 98.3 F (36.8 C) (Oral)  Ht 5' 8.3" (1.735 m)  Wt 158 lb (71.668 kg)  BMI 23.81 kg/m2 Well-developed male in no acute distress. HEENT exam atraumatic, normocephalic, extraocular muscles are intact. Conjunctivae are pink without exudate. Neck is supple without lymphadenopathy, thyromegaly, jugular venous distention. Chest is clear to auscultation without increased work of breathing. Cardiac exam S1-S2 are regular. The PMI is normal. No significant murmurs or gallops. Abdominal exam active bowel sounds, soft, nontender. No abdominal bruits. Extremities no clubbing cyanosis or edema. Peripheral pulses are normal without bruits. Neurologic exam alert and oriented without any motor or sensory deficits. Rectal exam normal  tone prostate normal size without masses or asymmetry.   Well Visit: Health Maint UTD.

## 2012-01-24 ENCOUNTER — Ambulatory Visit: Payer: BC Managed Care – PPO | Admitting: Internal Medicine

## 2012-05-18 ENCOUNTER — Encounter: Payer: Self-pay | Admitting: Family

## 2012-05-18 ENCOUNTER — Ambulatory Visit (INDEPENDENT_AMBULATORY_CARE_PROVIDER_SITE_OTHER): Payer: BC Managed Care – PPO | Admitting: Family

## 2012-05-18 VITALS — BP 116/62 | HR 94 | Wt 164.0 lb

## 2012-05-18 DIAGNOSIS — R82998 Other abnormal findings in urine: Secondary | ICD-10-CM

## 2012-05-18 DIAGNOSIS — B3749 Other urogenital candidiasis: Secondary | ICD-10-CM

## 2012-05-18 DIAGNOSIS — R3989 Other symptoms and signs involving the genitourinary system: Secondary | ICD-10-CM

## 2012-05-18 LAB — POCT URINALYSIS DIPSTICK
Blood, UA: NEGATIVE
Glucose, UA: NEGATIVE
Nitrite, UA: NEGATIVE
Protein, UA: NEGATIVE
Spec Grav, UA: 1.015
Urobilinogen, UA: 0.2

## 2012-05-18 NOTE — Progress Notes (Signed)
  Subjective:    Patient ID: Antonio Valdez, male    DOB: 1960-07-20, 52 y.o.   MRN: [id number]  HPI 52 year old white male, nonsmoker, patient of Dr. Cato Mulligan is in today with complaints of dark brown urine has been ongoing x3 days but improving today. He's concerned about having blood in his urine. Patient currently takes a multivitamin, vitamin C, D., and magnesium. The magnesium is new to his diet. He also has been eating an increased amount of beets over the last 3-4 days. He denies any burning with urination, frequency, urgency, or burning with urination.   Review of Systems  Constitutional: Negative.   Respiratory: Negative.   Cardiovascular: Negative.   Gastrointestinal: Negative.   Endocrine: Negative.   Musculoskeletal: Negative.   Skin: Negative.   Neurological: Negative.   Hematological: Negative.   Psychiatric/Behavioral: Negative.    Past Medical History  Diagnosis Date  . ALLERGIC RHINITIS CAUSE UNSPECIFIED 09/05/2009  . HERPES LABIALIS 09/05/2009  . UNSPECIFIED TACHYCARDIA 09/05/2009    History   Social History  . Marital Status: Married    Spouse Name: N/A    Number of Children: N/A  . Years of Education: N/A   Occupational History  . Not on file.   Social History Main Topics  . Smoking status: Never Smoker   . Smokeless tobacco: Never Used  . Alcohol Use: Yes  . Drug Use: No  . Sexually Active: Not on file   Other Topics Concern  . Not on file   Social History Narrative  . No narrative on file    History reviewed. No pertinent past surgical history.  Family History  Problem Relation Age of Onset  . Arthritis Mother   . Cancer Father     renal ca    No Known Allergies  No current outpatient prescriptions on file prior to visit.   No current facility-administered medications on file prior to visit.    BP 116/62  Pulse 94  Wt 164 lb (74.39 kg)  BMI 24.71 kg/m2  SpO2 99%chart    Objective:   Physical Exam  Constitutional: He is  oriented to person, place, and time. He appears well-developed and well-nourished.  Neck: Normal range of motion. Neck supple.  Cardiovascular: Normal rate, regular rhythm and normal heart sounds.   Pulmonary/Chest: Effort normal and breath sounds normal.  Musculoskeletal: Normal range of motion.  Neurological: He is alert and oriented to person, place, and time.  Skin: Skin is warm and dry.  Psychiatric: He has a normal mood and affect.          Assessment & Plan:  Assessment:  1. Discolored urine  Plan: I believe the discoloration is related to the multivitamins as well as to increase in beets. Urine is completely normal today. I will send a urine culture. Patient call the office with any questions or concerns. Recheck a schedule, and as needed.

## 2012-07-20 ENCOUNTER — Other Ambulatory Visit (INDEPENDENT_AMBULATORY_CARE_PROVIDER_SITE_OTHER): Payer: BC Managed Care – PPO

## 2012-07-20 DIAGNOSIS — Z0000 Encounter for general adult medical examination without abnormal findings: Secondary | ICD-10-CM

## 2012-07-20 LAB — LIPID PANEL
LDL Cholesterol: 123 mg/dL — ABNORMAL HIGH (ref 0–99)
Total CHOL/HDL Ratio: 3

## 2012-07-20 LAB — CBC WITH DIFFERENTIAL/PLATELET
Basophils Absolute: 0 10*3/uL (ref 0.0–0.1)
Basophils Relative: 0.8 % (ref 0.0–3.0)
Eosinophils Relative: 2.3 % (ref 0.0–5.0)
HCT: 47.2 % (ref 39.0–52.0)
Hemoglobin: 15.7 g/dL (ref 13.0–17.0)
Lymphocytes Relative: 24.7 % (ref 12.0–46.0)
Lymphs Abs: 1.2 10*3/uL (ref 0.7–4.0)
Monocytes Relative: 8.2 % (ref 3.0–12.0)
Neutro Abs: 3 10*3/uL (ref 1.4–7.7)
RBC: 4.9 Mil/uL (ref 4.22–5.81)
WBC: 4.7 10*3/uL (ref 4.5–10.5)

## 2012-07-20 LAB — HEPATIC FUNCTION PANEL
AST: 24 U/L (ref 0–37)
Alkaline Phosphatase: 54 U/L (ref 39–117)
Bilirubin, Direct: 0.1 mg/dL (ref 0.0–0.3)
Total Protein: 6.5 g/dL (ref 6.0–8.3)

## 2012-07-20 LAB — BASIC METABOLIC PANEL
BUN: 21 mg/dL (ref 6–23)
CO2: 25 mEq/L (ref 19–32)
Chloride: 102 mEq/L (ref 96–112)
Creatinine, Ser: 0.9 mg/dL (ref 0.4–1.5)
Glucose, Bld: 97 mg/dL (ref 70–99)

## 2012-07-20 LAB — TSH: TSH: 2.86 u[IU]/mL (ref 0.35–5.50)

## 2012-07-20 LAB — POCT URINALYSIS DIPSTICK
Bilirubin, UA: NEGATIVE
Ketones, UA: NEGATIVE
Leukocytes, UA: NEGATIVE
Spec Grav, UA: 1.015
pH, UA: 7.5

## 2012-07-27 ENCOUNTER — Ambulatory Visit (INDEPENDENT_AMBULATORY_CARE_PROVIDER_SITE_OTHER): Payer: BC Managed Care – PPO | Admitting: Internal Medicine

## 2012-07-27 ENCOUNTER — Encounter: Payer: Self-pay | Admitting: Internal Medicine

## 2012-07-27 VITALS — BP 114/72 | HR 92 | Temp 98.4°F | Ht 69.25 in | Wt 164.0 lb

## 2012-07-27 DIAGNOSIS — Z0000 Encounter for general adult medical examination without abnormal findings: Secondary | ICD-10-CM

## 2012-07-29 NOTE — Progress Notes (Signed)
Patient ID: Antonio Valdez, male   DOB: 03-31-1960, 51 y.o.   MRN: [id number] cpx  Past Medical History  Diagnosis Date  . ALLERGIC RHINITIS CAUSE UNSPECIFIED 09/05/2009  . HERPES LABIALIS 09/05/2009  . UNSPECIFIED TACHYCARDIA 09/05/2009    History   Social History  . Marital Status: Married    Spouse Name: N/A    Number of Children: N/A  . Years of Education: N/A   Occupational History  . Not on file.   Social History Main Topics  . Smoking status: Never Smoker   . Smokeless tobacco: Never Used  . Alcohol Use: Yes  . Drug Use: No  . Sexually Active: Not on file   Other Topics Concern  . Not on file   Social History Narrative  . No narrative on file    No past surgical history on file.  Family History  Problem Relation Age of Onset  . Arthritis Mother   . Cancer Father     renal ca    No Known Allergies  No current outpatient prescriptions on file prior to visit.   No current facility-administered medications on file prior to visit.     patient denies chest pain, shortness of breath, orthopnea. Denies lower extremity edema, abdominal pain, change in appetite, change in bowel movements. Patient denies rashes, musculoskeletal complaints. No other specific complaints in a complete review of systems.   BP 114/72  Pulse 92  Temp(Src) 98.4 F (36.9 C) (Oral)  Ht 5' 9.25" (1.759 m)  Wt 164 lb (74.39 kg)  BMI 24.04 kg/m2  well-developed well-nourished male in no acute distress. HEENT exam atraumatic, normocephalic, neck supple without jugular venous distention. Chest clear to auscultation cardiac exam S1-S2 are regular. Abdominal exam overweight with bowel sounds, soft and nontender. Extremities no edema. Neurologic exam is alert with a normal gait.  Well visit- health maint utd

## 2013-09-22 ENCOUNTER — Ambulatory Visit (INDEPENDENT_AMBULATORY_CARE_PROVIDER_SITE_OTHER): Payer: BC Managed Care – PPO | Admitting: Family Medicine

## 2013-09-22 VITALS — BP 106/72 | HR 85 | Temp 97.6°F | Resp 16 | Ht 68.5 in | Wt 166.0 lb

## 2013-09-22 DIAGNOSIS — H6502 Acute serous otitis media, left ear: Secondary | ICD-10-CM

## 2013-09-22 DIAGNOSIS — H9202 Otalgia, left ear: Secondary | ICD-10-CM

## 2013-09-22 DIAGNOSIS — H9209 Otalgia, unspecified ear: Secondary | ICD-10-CM

## 2013-09-22 DIAGNOSIS — H6500 Acute serous otitis media, unspecified ear: Secondary | ICD-10-CM

## 2013-09-22 MED ORDER — AMOXICILLIN 875 MG PO TABS: 875.0000 mg | ORAL_TABLET | Freq: Two times a day (BID) | ORAL | Status: DC

## 2013-09-22 NOTE — Progress Notes (Signed)
Subjective:  This chart was scribed for Antonio Ray, MD by Forrestine Him, [facility] Scribe. This patient was seen in room 3 and the patient's care was started 5:09 PM.    Patient ID: Antonio Valdez, male    DOB: [birth year], 53 y.o.   MRN: [id number]  Chief Complaint  Patient presents with  . Otalgia    X 4 days    HPI HPI Comments: Antonio Valdez is a 53 y.o. male who presents to [facility] complaining of constant, moderate L otalgia x 4 days that is unchanged. He reports a "crackling" sound to ear along with some tenderness just below L ear. He states he felt some water in his ear Saturday which has not resolved since onset of symptoms. He has tried OTC swimmers ear drops along with several home remedies without any improvement for symptoms. No fever, congestion, or chills at this time. He denies any recent environmental allergy flare ups. No known allergies to medications. No other concerns this visit.  Pt is followed by: Chancy Hurter, MD PCP  There are no active problems to display for this patient.  Past Medical History  Diagnosis Date  . ALLERGIC RHINITIS CAUSE UNSPECIFIED 09/05/2009  . HERPES LABIALIS 09/05/2009  . UNSPECIFIED TACHYCARDIA 09/05/2009  . Allergy    Past Surgical History  Procedure Laterality Date  . Hernia repair    . Spine surgery     No Known Allergies Prior to Admission medications   Not on File   History   Social History  . Marital Status: Married    Spouse Name: N/A    Number of Children: N/A  . Years of Education: N/A   Occupational History  . Not on file.   Social History Main Topics  . Smoking status: Never Smoker   . Smokeless tobacco: Never Used  . Alcohol Use: Yes  . Drug Use: No  . Sexual Activity: Not on file   Other Topics Concern  . Not on file   Social History Narrative  . No narrative on file      Review of Systems  Constitutional: Negative for fever and chills.  HENT:  Positive for ear pain. Negative for congestion, rhinorrhea and sore throat.      Objective:  Physical Exam  Nursing note and vitals reviewed. Constitutional: He is oriented to person, place, and time. He appears well-developed and well-nourished.  HENT:  Head: Normocephalic.  Right Ear: Hearing, tympanic membrane, external ear and ear canal normal. Tympanic membrane is not erythematous and not retracted.  Left Ear: External ear normal. Tympanic membrane is not erythematous and not retracted.  No tenderness to external L ear including no mastoid tenderness Canal is clear. No cerumen noted. No canal edema or erythema. Clear fluid noted to L TM approximately 1/3 behind TM but no TM erythema or retraction  Eyes: EOM are normal.  Neck: Normal range of motion.  Cardiovascular: Normal rate, regular rhythm and normal heart sounds.   Pulmonary/Chest: Effort normal and breath sounds normal.  Abdominal: He exhibits no distension.  Musculoskeletal: Normal range of motion.  Neurological: He is alert and oriented to person, place, and time.  Psychiatric: He has a normal mood and affect.     Filed Vitals:   09/22/13 1646  BP: 106/72  Pulse: 85  Temp: 97.6 F (36.4 C)  TempSrc: Oral  Resp: 16  Height: 5' 8.5" (1.74 m)  Weight: 166 lb (75.297 kg)  SpO2: 98%    Assessment & Plan:    Antonio Valdez is a 53 y.o. male Acute serous otitis media of left ear, recurrence not specified - Plan: amoxicillin (AMOXIL) 875 MG tablet  Ear pain, left - Plan: amoxicillin (AMOXIL) 875 MG tablet   suspected L serous otitis/ETD. No sign of infection at this time, but with some soreness today - printed Rx for amox if increased pain or other sx's of infection.   -start flonase NS, afrin if needed - discussed no more than 3 days. Consider ENT eval if decr hearing or persistence of sx's.   -RTC precautions.    Meds ordered this encounter  Medications  . amoxicillin (AMOXIL) 875 MG tablet    Sig: Take 1  tablet (875 mg total) by mouth 2 (two) times daily.    Dispense:  20 tablet    Refill:  0   Patient Instructions  Your ear does not appear infected, but some clear fluid behind eardrum. Can try over the counter flonase nasal spray, and 3-4 days of Afrin only.  See other information below.  If not improving in next 2-3 weeks, return or I can refer you to ear nose and throat specialist if needed for evaluation. Return to the clinic or go to the nearest emergency room if any of your symptoms worsen or new symptoms occur.  If increased pain, hot and cold spells or other symptoms of this turning into infection - can start antibiotic I printed, but if this is taken and not improving in 2-3 days - follow up with provider for recheck.   Serous Otitis Media Serous otitis media is fluid in the middle ear space. This space contains the bones for hearing and air. Air in the middle ear space helps to transmit sound.  The air gets there through the eustachian tube. This tube goes from the back of the nose (nasopharynx) to the middle ear space. It keeps the pressure in the middle ear the same as the outside world. It also helps to drain fluid from the middle ear space. CAUSES  Serous otitis media occurs when the eustachian tube gets blocked. Blockage can come from:  Ear infections.  Colds and other upper respiratory infections.  Allergies.  Irritants such as cigarette smoke.  Sudden changes in air pressure (such as descending in an airplane).  Enlarged adenoids.  A mass in the nasopharynx. During colds and upper respiratory infections, the middle ear space can become temporarily filled with fluid. This can happen after an ear infection also. Once the infection clears, the fluid will generally drain out of the ear through the eustachian tube. If it does not, then serous otitis media occurs. SIGNS AND SYMPTOMS   Hearing loss.  A feeling of fullness in the ear, without pain.  Young children may not  show any symptoms but may show slight behavioral changes, such as agitation, ear pulling, or crying. DIAGNOSIS  Serous otitis media is diagnosed by an ear exam. Tests may be done to check on the movement of the eardrum. Hearing exams may also be done. TREATMENT  The fluid most often goes away without treatment. If allergy is the cause, allergy treatment may be helpful. Fluid that persists for several months may require minor surgery. A small tube is placed in the eardrum to:  Drain the fluid.  Restore the air in the middle ear space. In certain situations, antibiotic medicines are used to avoid surgery. Surgery may be done to remove enlarged adenoids (  if this is the cause). HOME CARE INSTRUCTIONS   Keep children away from tobacco smoke.  Keep all follow-up visits as directed by your health care provider. SEEK MEDICAL CARE IF:   Your hearing is not better in 3 months.  Your hearing is worse.  You have ear pain.  You have drainage from the ear.  You have dizziness.  You have serous otitis media only in one ear or have any bleeding from your nose (epistaxis).  You notice a lump on your neck. MAKE SURE YOU:  Understand these instructions.   Will watch your condition.   Will get help right away if you are not doing well or get worse.  Document Released: 03/30/2003 Document Revised: 05/24/2013 Document Reviewed: 08/04/2012 [facility] Patient Information 2015 [place], [place]. This information is not intended to replace advice given to you by your health care provider. Make sure you discuss any questions you have with your health care provider.      I personally performed the services described in this documentation, which was scribed in my presence. The recorded information has been reviewed and is accurate.

## 2013-09-22 NOTE — Patient Instructions (Signed)
Your ear does not appear infected, but some clear fluid behind eardrum. Can try over the counter flonase nasal spray, and 3-4 days of Afrin only.  See other information below.  If not improving in next 2-3 weeks, return or I can refer you to ear nose and throat specialist if needed for evaluation. Return to the clinic or go to the nearest emergency room if any of your symptoms worsen or new symptoms occur.  If increased pain, hot and cold spells or other symptoms of this turning into infection - can start antibiotic I printed, but if this is taken and not improving in 2-3 days - follow up with provider for recheck.   Serous Otitis Media Serous otitis media is fluid in the middle ear space. This space contains the bones for hearing and air. Air in the middle ear space helps to transmit sound.  The air gets there through the eustachian tube. This tube goes from the back of the nose (nasopharynx) to the middle ear space. It keeps the pressure in the middle ear the same as the outside world. It also helps to drain fluid from the middle ear space. CAUSES  Serous otitis media occurs when the eustachian tube gets blocked. Blockage can come from:  Ear infections.  Colds and other upper respiratory infections.  Allergies.  Irritants such as cigarette smoke.  Sudden changes in air pressure (such as descending in an airplane).  Enlarged adenoids.  A mass in the nasopharynx. During colds and upper respiratory infections, the middle ear space can become temporarily filled with fluid. This can happen after an ear infection also. Once the infection clears, the fluid will generally drain out of the ear through the eustachian tube. If it does not, then serous otitis media occurs. SIGNS AND SYMPTOMS   Hearing loss.  A feeling of fullness in the ear, without pain.  Young children may not show any symptoms but may show slight behavioral changes, such as agitation, ear pulling, or crying. DIAGNOSIS    Serous otitis media is diagnosed by an ear exam. Tests may be done to check on the movement of the eardrum. Hearing exams may also be done. TREATMENT  The fluid most often goes away without treatment. If allergy is the cause, allergy treatment may be helpful. Fluid that persists for several months may require minor surgery. A small tube is placed in the eardrum to:  Drain the fluid.  Restore the air in the middle ear space. In certain situations, antibiotic medicines are used to avoid surgery. Surgery may be done to remove enlarged adenoids (if this is the cause). HOME CARE INSTRUCTIONS   Keep children away from tobacco smoke.  Keep all follow-up visits as directed by your health care provider. SEEK MEDICAL CARE IF:   Your hearing is not better in 3 months.  Your hearing is worse.  You have ear pain.  You have drainage from the ear.  You have dizziness.  You have serous otitis media only in one ear or have any bleeding from your nose (epistaxis).  You notice a lump on your neck. MAKE SURE YOU:  Understand these instructions.   Will watch your condition.   Will get help right away if you are not doing well or get worse.  Document Released: 03/30/2003 Document Revised: 05/24/2013 Document Reviewed: 08/04/2012 [facility] Patient Information 2015 [place], [place]. This information is not intended to replace advice given to you by your health care provider. Make sure you discuss any questions you  have with your health care provider.  

## 2013-10-06 ENCOUNTER — Encounter: Payer: Self-pay | Admitting: Internal Medicine

## 2014-09-27 ENCOUNTER — Encounter: Payer: Self-pay | Admitting: Adult Health

## 2014-09-27 ENCOUNTER — Ambulatory Visit (INDEPENDENT_AMBULATORY_CARE_PROVIDER_SITE_OTHER): Payer: BLUE CROSS/BLUE SHIELD | Admitting: Adult Health

## 2014-09-27 VITALS — BP 120/80 | Temp 98.1°F | Ht 68.5 in | Wt 169.7 lb

## 2014-09-27 DIAGNOSIS — Z7689 Persons encountering health services in other specified circumstances: Secondary | ICD-10-CM

## 2014-09-27 DIAGNOSIS — Z7189 Other specified counseling: Secondary | ICD-10-CM | POA: Diagnosis not present

## 2014-09-27 NOTE — Patient Instructions (Signed)
It was great meeting you today!  Follow up as soon as you can for a physical. If you need anything in the meantime, please let me know.   Continue to eat healthy and try and start exercising, even if it is walking.

## 2014-09-27 NOTE — Progress Notes (Signed)
HPI:  Antonio Valdez is here to establish care. He is a very pleasant Caucasian male with  has a past medical history of ALLERGIC RHINITIS CAUSE UNSPECIFIED (09/05/2009); HERPES LABIALIS (09/05/2009); UNSPECIFIED TACHYCARDIA (09/05/2009); and Allergy.  Last PCP and physical: 09/2912 with Dr. Leanne Chang.   Immunizations:UTD does not want flu vaccination at this time.  Diet:Eats healthy, organic food.  Exercise:Does not exercise, he travels weekly for work.  Colonoscopy: 10941 at 54 year old due to father having renal cancer?   Has the following chronic problems that require follow up and concerns today:  Has no complaints today.   ROS negative for unless reported above: fevers, chills,feeling poorly, unintentional weight loss, hearing or vision loss, chest pain, palpitations, leg claudication, struggling to breath,Not feeling congested in the chest, no orthopenia, no cough,no wheezing, normal appetite, no soft tissue swelling, no hemoptysis, melena, hematochezia, hematuria, falls, loc, si, or thoughts of self harm.    Past Medical History  Diagnosis Date  . ALLERGIC RHINITIS CAUSE UNSPECIFIED 09/05/2009  . HERPES LABIALIS 09/05/2009  . UNSPECIFIED TACHYCARDIA 09/05/2009  . Allergy     Past Surgical History  Procedure Laterality Date  . Hernia repair    . Spine surgery      Family History  Problem Relation Age of Onset  . Arthritis Mother   . Hyperlipidemia Mother   . Cancer Father     renal ca    Social History   Social History  . Marital Status: Married    Spouse Name: N/A  . Number of Children: N/A  . Years of Education: N/A   Social History Main Topics  . Smoking status: Never Smoker   . Smokeless tobacco: Never Used  . Alcohol Use: Yes  . Drug Use: No  . Sexual Activity: Not Asked   Other Topics Concern  . None   Social History Narrative    No current outpatient prescriptions on file.  EXAM:  Filed Vitals:   09/27/14 1412  BP: 120/80  Temp: 98.1 F  (36.7 C)    Body mass index is 25.42 kg/(m^2).  GENERAL: vitals reviewed and listed above, alert, oriented, appears well hydrated and in no acute distress.    HEENT: atraumatic, conjunttiva clear, no obvious abnormalities on inspection of external nose and ears. TM's visualized and no cerumen impaction.   NECK: Neck is soft and supple without masses, no adenopathy or thyromegaly, trachea midline, no JVD. Normal range of motion.   LUNGS: clear to auscultation bilaterally, no wheezes, rales or rhonchi, good air movement  CV: Regular rate and rhythm, normal S1/S2, no audible murmurs, gallops, or rubs. No carotid bruit and no peripheral edema.   MS: moves all extremities without noticeable abnormality. No edema noted  Abd: soft/nontender/nondistended/normal bowel sounds   Skin: warm and dry, no rash   Extremities: No clubbing, cyanosis, or edema. Capillary refill is WNL. Pulses intact bilaterally in upper and lower extremities.   Neuro: CN II-XII intact, sensation and reflexes normal throughout, 5/5 muscle strength in bilateral upper and lower extremities. Normal finger to nose. Normal rapid alternating movements.   PSYCH: pleasant and cooperative, no obvious depression or anxiety  ASSESSMENT AND PLAN:  1. Encounter to establish care - Follow up in one month for CPE - Follow up sooner if needed - Continue to eat healthy and start an exercise regimen.  - Ambulatory referral to Gastroenterology    Discussed the following assessment and plan:  -We reviewed the PMH, PSH, FH, SH, Meds  and Allergies. -We provided refills for any medications we will prescribe as needed. -We addressed current concerns per orders and patient instructions. -We have asked for records for pertinent exams, studies, vaccines and notes from previous providers. -We have advised patient to follow up per instructions below.   -Patient advised to return or notify a provider immediately if symptoms worsen or  persist or new concerns arise.    Dorothyann Peng, AGNP

## 2014-11-08 ENCOUNTER — Other Ambulatory Visit (INDEPENDENT_AMBULATORY_CARE_PROVIDER_SITE_OTHER): Payer: BLUE CROSS/BLUE SHIELD

## 2014-11-08 DIAGNOSIS — Z0000 Encounter for general adult medical examination without abnormal findings: Secondary | ICD-10-CM | POA: Diagnosis not present

## 2014-11-08 LAB — CBC WITH DIFFERENTIAL/PLATELET
BASOS ABS: 0 10*3/uL (ref 0.0–0.1)
Basophils Relative: 0.5 % (ref 0.0–3.0)
Eosinophils Absolute: 0.2 10*3/uL (ref 0.0–0.7)
Eosinophils Relative: 4.2 % (ref 0.0–5.0)
HCT: 46.6 % (ref 39.0–52.0)
Hemoglobin: 15.7 g/dL (ref 13.0–17.0)
LYMPHS ABS: 1.3 10*3/uL (ref 0.7–4.0)
Lymphocytes Relative: 28.4 % (ref 12.0–46.0)
MCHC: 33.7 g/dL (ref 30.0–36.0)
MCV: 93.5 fl (ref 78.0–100.0)
MONO ABS: 0.4 10*3/uL (ref 0.1–1.0)
MONOS PCT: 8.4 % (ref 3.0–12.0)
NEUTROS ABS: 2.6 10*3/uL (ref 1.4–7.7)
NEUTROS PCT: 58.5 % (ref 43.0–77.0)
PLATELETS: 238 10*3/uL (ref 150.0–400.0)
RBC: 4.98 Mil/uL (ref 4.22–5.81)
RDW: 12.8 % (ref 11.5–15.5)
WBC: 4.5 10*3/uL (ref 4.0–10.5)

## 2014-11-08 LAB — LIPID PANEL
CHOL/HDL RATIO: 4
CHOLESTEROL: 200 mg/dL (ref 0–200)
HDL: 50.4 mg/dL (ref >39.00)
LDL CALC: 137 mg/dL — AB (ref 0–99)
NonHDL: 149.36
TRIGLYCERIDES: 62 mg/dL (ref 0.0–149.0)
VLDL: 12.4 mg/dL (ref 0.0–40.0)

## 2014-11-08 LAB — BASIC METABOLIC PANEL
BUN: 18 mg/dL (ref 6–23)
CALCIUM: 9.6 mg/dL (ref 8.4–10.5)
CO2: 32 meq/L (ref 19–32)
Chloride: 105 mEq/L (ref 96–112)
Creatinine, Ser: 1.02 mg/dL (ref 0.40–1.50)
GFR: 80.54 mL/min (ref >60.00)
GLUCOSE: 98 mg/dL (ref 70–99)
Potassium: 4.7 mEq/L (ref 3.5–5.1)
SODIUM: 143 meq/L (ref 135–145)

## 2014-11-08 LAB — HEPATIC FUNCTION PANEL
ALT: 29 U/L (ref 0–53)
AST: 19 U/L (ref 0–37)
Albumin: 4.3 g/dL (ref 3.5–5.2)
Alkaline Phosphatase: 53 U/L (ref 39–117)
BILIRUBIN DIRECT: 0.1 mg/dL (ref 0.0–0.3)
BILIRUBIN TOTAL: 0.6 mg/dL (ref 0.2–1.2)
Total Protein: 6.7 g/dL (ref 6.0–8.3)

## 2014-11-08 LAB — POCT URINALYSIS DIPSTICK
Bilirubin, UA: NEGATIVE
GLUCOSE UA: NEGATIVE
KETONES UA: NEGATIVE
Leukocytes, UA: NEGATIVE
Nitrite, UA: NEGATIVE
Protein, UA: NEGATIVE
RBC UA: NEGATIVE
SPEC GRAV UA: 1.02
UROBILINOGEN UA: 0.2
pH, UA: 7

## 2014-11-08 LAB — TSH: TSH: 2.23 u[IU]/mL (ref 0.35–4.50)

## 2014-11-08 LAB — PSA: PSA: 0.35 ng/mL (ref 0.10–4.00)

## 2014-11-22 ENCOUNTER — Ambulatory Visit (INDEPENDENT_AMBULATORY_CARE_PROVIDER_SITE_OTHER): Payer: BLUE CROSS/BLUE SHIELD | Admitting: Adult Health

## 2014-11-22 ENCOUNTER — Encounter: Payer: Self-pay | Admitting: Adult Health

## 2014-11-22 VITALS — Temp 97.4°F | Ht 68.5 in | Wt 170.7 lb

## 2014-11-22 DIAGNOSIS — Z0000 Encounter for general adult medical examination without abnormal findings: Secondary | ICD-10-CM | POA: Diagnosis not present

## 2014-11-22 NOTE — Patient Instructions (Addendum)
It was great seeing you again!  Your exam was great! Just start exercising.   Follow up with me in one year for your next physical. Let me know if you need anything in the meantime.   Health Maintenance, Male A healthy lifestyle and preventative care can promote health and wellness.  Maintain regular health, dental, and eye exams.  Eat a healthy diet. Foods like vegetables, fruits, whole grains, low-fat dairy products, and lean protein foods contain the nutrients you need and are low in calories. Decrease your intake of foods high in solid fats, added sugars, and salt. Get information about a proper diet from your health care provider, if necessary.  Regular physical exercise is one of the most important things you can do for your health. Most adults should get at least 150 minutes of moderate-intensity exercise (any activity that increases your heart rate and causes you to sweat) each week. In addition, most adults need muscle-strengthening exercises on 2 or more days a week.   Maintain a healthy weight. The body mass index (BMI) is a screening tool to identify possible weight problems. It provides an estimate of body fat based on height and weight. Your health care provider can find your BMI and can help you achieve or maintain a healthy weight. For males 20 years and older:  A BMI below 18.5 is considered underweight.  A BMI of 18.5 to 24.9 is normal.  A BMI of 25 to 29.9 is considered overweight.  A BMI of 30 and above is considered obese.  Maintain normal blood lipids and cholesterol by exercising and minimizing your intake of saturated fat. Eat a balanced diet with plenty of fruits and vegetables. Blood tests for lipids and cholesterol should begin at age 65 and be repeated every 5 years. If your lipid or cholesterol levels are high, you are over age 58, or you are at high risk for heart disease, you may need your cholesterol levels checked more frequently.Ongoing high lipid and  cholesterol levels should be treated with medicines if diet and exercise are not working.  If you smoke, find out from your health care provider how to quit. If you do not use tobacco, do not start.  Lung cancer screening is recommended for adults aged 29-80 years who are at high risk for developing lung cancer because of a history of smoking. A yearly low-dose CT scan of the lungs is recommended for people who have at least a 30-pack-year history of smoking and are current smokers or have quit within the past 15 years. A pack year of smoking is smoking an average of 1 pack of cigarettes a day for 1 year (for example, a 30-pack-year history of smoking could mean smoking 1 pack a day for 30 years or 2 packs a day for 15 years). Yearly screening should continue until the smoker has stopped smoking for at least 15 years. Yearly screening should be stopped for people who develop a health problem that would prevent them from having lung cancer treatment.  If you choose to drink alcohol, do not have more than 2 drinks per day. One drink is considered to be 12 oz (360 mL) of beer, 5 oz (150 mL) of wine, or 1.5 oz (45 mL) of liquor.  Avoid the use of street drugs. Do not share needles with anyone. Ask for help if you need support or instructions about stopping the use of drugs.  High blood pressure causes heart disease and increases the risk of stroke.  High blood pressure is more likely to develop in:  People who have blood pressure in the end of the normal range (100-139/85-89 mm Hg).  People who are overweight or obese.  People who are African American.  If you are 63-55 years of age, have your blood pressure checked every 3-5 years. If you are 91 years of age or older, have your blood pressure checked every year. You should have your blood pressure measured twice--once when you are at a hospital or clinic, and once when you are not at a hospital or clinic. Record the average of the two measurements. To  check your blood pressure when you are not at a hospital or clinic, you can use:  An automated blood pressure machine at a pharmacy.  A home blood pressure monitor.  If you are 75-34 years old, ask your health care provider if you should take aspirin to prevent heart disease.  Diabetes screening involves taking a blood sample to check your fasting blood sugar level. This should be done once every 3 years after age 106 if you are at a normal weight and without risk factors for diabetes. Testing should be considered at a younger age or be carried out more frequently if you are overweight and have at least 1 risk factor for diabetes.  Colorectal cancer can be detected and often prevented. Most routine colorectal cancer screening begins at the age of 27 and continues through age 50. However, your health care provider may recommend screening at an earlier age if you have risk factors for colon cancer. On a yearly basis, your health care provider may provide home test kits to check for hidden blood in the stool. A small camera at the end of a tube may be used to directly examine the colon (sigmoidoscopy or colonoscopy) to detect the earliest forms of colorectal cancer. Talk to your health care provider about this at age 74 when routine screening begins. A direct exam of the colon should be repeated every 5-10 years through age 51, unless early forms of precancerous polyps or small growths are found.  People who are at an increased risk for hepatitis B should be screened for this virus. You are considered at high risk for hepatitis B if:  You were born in a country where hepatitis B occurs often. Talk with your health care provider about which countries are considered high risk.  Your parents were born in a high-risk country and you have not received a shot to protect against hepatitis B (hepatitis B vaccine).  You have HIV or AIDS.  You use needles to inject street drugs.  You live with, or have sex  with, someone who has hepatitis B.  You are a man who has sex with other men (MSM).  You get hemodialysis treatment.  You take certain medicines for conditions like cancer, organ transplantation, and autoimmune conditions.  Hepatitis C blood testing is recommended for all people born from [birth year] through 1966 and any individual with known risk factors for hepatitis C.  Healthy men should no longer receive prostate-specific antigen (PSA) blood tests as part of routine cancer screening. Talk to your health care provider about prostate cancer screening.  Testicular cancer screening is not recommended for adolescents or adult males who have no symptoms. Screening includes self-exam, a health care provider exam, and other screening tests. Consult with your health care provider about any symptoms you have or any concerns you have about testicular cancer.  Practice safe sex. Use condoms  and avoid high-risk sexual practices to reduce the spread of sexually transmitted infections (STIs).  You should be screened for STIs, including gonorrhea and chlamydia if:  You are sexually active and are younger than 24 years.  You are older than 24 years, and your health care provider tells you that you are at risk for this type of infection.  Your sexual activity has changed since you were last screened, and you are at an increased risk for chlamydia or gonorrhea. Ask your health care provider if you are at risk.  If you are at risk of being infected with HIV, it is recommended that you take a prescription medicine daily to prevent HIV infection. This is called pre-exposure prophylaxis (PrEP). You are considered at risk if:  You are a man who has sex with other men (MSM).  You are a heterosexual man who is sexually active with multiple partners.  You take drugs by injection.  You are sexually active with a partner who has HIV.  Talk with your health care provider about whether you are at high risk of being  infected with HIV. If you choose to begin PrEP, you should first be tested for HIV. You should then be tested every 3 months for as long as you are taking PrEP.  Use sunscreen. Apply sunscreen liberally and repeatedly throughout the day. You should seek shade when your shadow is shorter than you. Protect yourself by wearing long sleeves, pants, a wide-brimmed hat, and sunglasses year round whenever you are outdoors.  Tell your health care provider of new moles or changes in moles, especially if there is a change in shape or color. Also, tell your health care provider if a mole is larger than the size of a pencil eraser.  A one-time screening for abdominal aortic aneurysm (AAA) and surgical repair of large AAAs by ultrasound is recommended for men aged 79-75 years who are current or former smokers.  Stay current with your vaccines (immunizations).   This information is not intended to replace advice given to you by your health care provider. Make sure you discuss any questions you have with your health care provider.   Document Released: 07/06/2007 Document Revised: 01/28/2014 Document Reviewed: 06/04/2010 Elsevier Interactive Patient Education [employer].

## 2014-11-22 NOTE — Progress Notes (Signed)
Subjective:    Patient ID: Antonio Valdez, male    DOB: 09/03/1960, 54 y.o.   MRN: [id number]  HPI Patient presents for yearly preventative medicine examination. He is a very healthy caucasian male. He takes no medications and he  has a past medical history of ALLERGIC RHINITIS CAUSE UNSPECIFIED (09/05/2009); HERPES LABIALIS (09/05/2009); UNSPECIFIED TACHYCARDIA (09/05/2009); and Allergy.  All immunizations and health maintenance protocols were reviewed with the patient and needed orders were placed.  Appropriate screening laboratory values were reviewed with the patient including screening of hyperlipidemia, renal function and hepatic function.  Medication reconciliation,  past medical history, social history, problem list and allergies were reviewed in detail with the patient  Goals were established with regard to weight loss, exercise, and  diet in compliance with medications  End of life planning was discussed- Does not have power of attorney or living will.   He has no complaints today  He is due for a colonoscopy at age 56 due to family history of colon cancer. He wil contact insurance about this. Consult placed   Review of Systems  Constitutional: Negative.   HENT: Negative.   Eyes: Negative.   Respiratory: Negative.   Cardiovascular: Negative.   Gastrointestinal: Negative.   Endocrine: Negative.   Genitourinary: Negative.   Musculoskeletal: Negative.   Skin: Negative.   Allergic/Immunologic: Negative.   Neurological: Negative.   Hematological: Negative.   Psychiatric/Behavioral: Negative.    Past Medical History  Diagnosis Date  . ALLERGIC RHINITIS CAUSE UNSPECIFIED 09/05/2009  . HERPES LABIALIS 09/05/2009  . UNSPECIFIED TACHYCARDIA 09/05/2009  . Allergy     Social History   Social History  . Marital Status: Married    Spouse Name: N/A  . Number of Children: N/A  . Years of Education: N/A   Occupational History  . Not on file.   Social History Main Topics    . Smoking status: Never Smoker   . Smokeless tobacco: Never Used  . Alcohol Use: 0.0 oz/week    0 Standard drinks or equivalent per week     Comment: occ.   . Drug Use: No  . Sexual Activity: Not on file   Other Topics Concern  . Not on file   Social History Narrative   Works in [employer].    Married with no kids. Married for 11 years.           Past Surgical History  Procedure Laterality Date  . Hernia repair    . Spine surgery      Family History  Problem Relation Age of Onset  . Arthritis Mother   . Hyperlipidemia Mother   . Cancer Father     renal ca    No Known Allergies  No current outpatient prescriptions on file prior to visit.   No current facility-administered medications on file prior to visit.    Temp(Src) 97.4 F (36.3 C) (Oral)  Ht 5' 8.5" (1.74 m)  Wt 170 lb 11.2 oz (77.429 kg)  BMI 25.57 kg/m2       Objective:   Physical Exam  Constitutional: He is oriented to person, place, and time. He appears well-developed and well-nourished. No distress.  HENT:  Head: Normocephalic and atraumatic.  Right Ear: External ear normal.  Left Ear: External ear normal.  Mouth/Throat: Oropharynx is clear and moist. No oropharyngeal exudate.  Eyes: Conjunctivae and EOM are normal. Pupils are equal, round, and reactive to light. Right eye exhibits no discharge. Left eye exhibits no discharge. No  scleral icterus.  Neck: Normal range of motion. Neck supple. No JVD present. No tracheal deviation present. No thyromegaly present.  Cardiovascular: Normal rate, regular rhythm, normal heart sounds and intact distal pulses.  Exam reveals no gallop and no friction rub.   No murmur heard. Pulmonary/Chest: Effort normal and breath sounds normal. No respiratory distress. He has no wheezes. He has no rales. He exhibits no tenderness.  Abdominal: Soft. Bowel sounds are normal.  Genitourinary: Rectum normal, prostate normal and penis normal. Guaiac negative stool. No penile  tenderness.  Musculoskeletal: Normal range of motion. He exhibits no edema or tenderness.  Lymphadenopathy:    He has no cervical adenopathy.  Neurological: He is alert and oriented to person, place, and time. He has normal reflexes. No cranial nerve deficit.  Skin: Skin is warm and dry. No rash noted. No erythema.  Psychiatric: He has a normal mood and affect. His behavior is normal. Judgment and thought content normal.  Nursing note and vitals reviewed.      Assessment & Plan:  1. Routine general medical examination at a health care facility - Reviewed labs  - has no medication that need renewed - Stressed the importance of diet and exercise. He would like to lose a little weight around the abdomen.

## 2014-11-22 NOTE — Progress Notes (Signed)
Pre visit review using our clinic review tool, if applicable. No additional management support is needed unless otherwise documented below in the visit note. 

## 2014-11-22 NOTE — Addendum Note (Signed)
Addended by: Dorothyann Peng L on: 11/22/2014 11:00 AM   Modules accepted: SmartSet

## 2015-03-15 ENCOUNTER — Encounter: Payer: Self-pay | Admitting: Internal Medicine

## 2015-03-28 ENCOUNTER — Encounter: Payer: Self-pay | Admitting: Internal Medicine

## 2015-04-07 ENCOUNTER — Encounter: Payer: Self-pay | Admitting: Internal Medicine

## 2015-05-22 ENCOUNTER — Ambulatory Visit (AMBULATORY_SURGERY_CENTER): Payer: Self-pay | Admitting: *Deleted

## 2015-05-22 VITALS — Ht 69.25 in | Wt 172.4 lb

## 2015-05-22 DIAGNOSIS — Z1211 Encounter for screening for malignant neoplasm of colon: Secondary | ICD-10-CM

## 2015-05-22 MED ORDER — NA SULFATE-K SULFATE-MG SULF 17.5-3.13-1.6 GM/177ML PO SOLN: 1.0000 | Freq: Once | ORAL | Status: DC

## 2015-05-22 NOTE — Progress Notes (Signed)
No egg or soy allergy known to patient  No issues with past sedation with any surgeries  or procedures, no intubation problems  No diet pills per patient No home 02 use per patient  No blood thinners per patient  Pt denies issues with constipation  emmi declined

## 2015-05-25 ENCOUNTER — Encounter: Payer: Self-pay | Admitting: Internal Medicine

## 2015-06-07 ENCOUNTER — Ambulatory Visit (AMBULATORY_SURGERY_CENTER): Payer: BLUE CROSS/BLUE SHIELD | Admitting: Internal Medicine

## 2015-06-07 ENCOUNTER — Encounter: Payer: Self-pay | Admitting: Internal Medicine

## 2015-06-07 VITALS — BP 103/73 | HR 68 | Temp 98.0°F | Resp 13 | Ht 69.0 in | Wt 172.0 lb

## 2015-06-07 DIAGNOSIS — D124 Benign neoplasm of descending colon: Secondary | ICD-10-CM | POA: Diagnosis not present

## 2015-06-07 DIAGNOSIS — Z1211 Encounter for screening for malignant neoplasm of colon: Secondary | ICD-10-CM

## 2015-06-07 DIAGNOSIS — D123 Benign neoplasm of transverse colon: Secondary | ICD-10-CM | POA: Diagnosis not present

## 2015-06-07 DIAGNOSIS — K6389 Other specified diseases of intestine: Secondary | ICD-10-CM

## 2015-06-07 MED ORDER — SODIUM CHLORIDE 0.9 % IV SOLN: 500.0000 mL | INTRAVENOUS | Status: DC

## 2015-06-07 NOTE — Op Note (Signed)
[facility] Patient Name: Antonio Valdez Procedure Date: 06/07/2015 8:47 AM MRN: [id number] Endoscopist: Docia Chuck. Henrene Pastor , MD Age: 55 Referring MD:  Date of Birth: 04/05/1960 Gender: Male Procedure:                Colonoscopy, with cold snare polypectomy -2 Indications:              Screening for colorectal malignant neoplasm.                            Negative colonoscopy 2010 Medicines:                Monitored Anesthesia Care Procedure:                Pre-Anesthesia Assessment:                           - Prior to the procedure, a History and Physical                            was performed, and patient medications and                            allergies were reviewed. The patient's tolerance of                            previous anesthesia was also reviewed. The risks                            and benefits of the procedure and the sedation                            options and risks were discussed with the patient.                            All questions were answered, and informed consent                            was obtained. Prior Anticoagulants: The patient has                            taken no previous anticoagulant or antiplatelet                            agents. ASA Grade Assessment: II - A patient with                            mild systemic disease. After reviewing the risks                            and benefits, the patient was deemed in                            satisfactory condition to undergo the procedure.  After obtaining informed consent, the colonoscope                            was passed under direct vision. Throughout the                            procedure, the patient's blood pressure, pulse, and                            oxygen saturations were monitored continuously. The                            Model CF-HQ190L [id number]) scope was introduced                            through the anus and advanced to  the the cecum,                            identified by appendiceal orifice and ileocecal                            valve. The ileocecal valve, appendiceal orifice,                            and rectum were photographed. The quality of the                            bowel preparation was excellent. The colonoscopy                            was performed without difficulty. The patient                            tolerated the procedure well. The bowel preparation                            used was SUPREP. Scope In: 9:00:57 AM Scope Out: 9:14:17 AM Scope Withdrawal Time: 0 hours 11 minutes 20 seconds  Total Procedure Duration: 0 hours 13 minutes 20 seconds  Findings:                 The digital rectal exam was normal.                           Two polyps were found in the descending colon and                            transverse colon. The polyps were 2 mm in size.                            These polyps were removed with a cold snare.                            Resection and retrieval were complete.  The exam was otherwise without abnormality on                            direct and retroflexion views. Complications:            No immediate complications. Estimated blood loss:                            None. Estimated Blood Loss:     Estimated blood loss: none. Impression:               - Two 2 mm polyps in the descending colon and in                            the transverse colon, removed with a cold snare.                            Resected and retrieved.                           - The examination was otherwise normal on direct                            and retroflexion views. Recommendation:           - Repeat colonoscopy in 5-10 years for surveillance.                           - Patient has a contact number available for                            emergencies. The signs and symptoms of potential                            delayed complications were  discussed with the                            patient. Return to normal activities tomorrow.                            Written discharge instructions were provided to the                            patient.                           - Resume previous diet.                           - Continue present medications.                           - Await pathology results. Docia Chuck. Henrene Pastor, MD 06/07/2015 9:19:14 AM This report has been signed electronically. CC Letter to:             [employer]

## 2015-06-07 NOTE — Progress Notes (Signed)
A and Ox 3 Report to RN 

## 2015-06-07 NOTE — Progress Notes (Signed)
Called to room to assist during endoscopic procedure.  Patient ID and intended procedure confirmed with present staff. Received instructions for my participation in the procedure from the performing physician.  

## 2015-06-08 ENCOUNTER — Telehealth: Payer: Self-pay

## 2015-06-08 NOTE — Telephone Encounter (Signed)
  Follow up Call-  Call back number 06/07/2015  Post procedure Call Back phone  # [place] to leave phone message Yes     Patient questions:  Do you have a fever, pain , or abdominal swelling? No. Pain Score  0 *  Have you tolerated food without any problems? Yes.    Have you been able to return to your normal activities? Yes.    Do you have any questions about your discharge instructions: Diet   No. Medications  No. Follow up visit  No.  Do you have questions or concerns about your Care? No.  Actions: * If pain score is 4 or above: No action needed, pain <4.

## 2015-06-13 ENCOUNTER — Encounter: Payer: Self-pay | Admitting: Internal Medicine

## 2016-04-05 ENCOUNTER — Other Ambulatory Visit (INDEPENDENT_AMBULATORY_CARE_PROVIDER_SITE_OTHER): Payer: BLUE CROSS/BLUE SHIELD

## 2016-04-05 DIAGNOSIS — R319 Hematuria, unspecified: Secondary | ICD-10-CM | POA: Diagnosis not present

## 2016-04-05 DIAGNOSIS — Z0000 Encounter for general adult medical examination without abnormal findings: Secondary | ICD-10-CM

## 2016-04-05 LAB — CBC WITH DIFFERENTIAL/PLATELET
BASOS ABS: 0 10*3/uL (ref 0.0–0.1)
Basophils Relative: 0.9 % (ref 0.0–3.0)
EOS PCT: 1.6 % (ref 0.0–5.0)
Eosinophils Absolute: 0.1 10*3/uL (ref 0.0–0.7)
HCT: 44.4 % (ref 39.0–52.0)
Hemoglobin: 15 g/dL (ref 13.0–17.0)
LYMPHS ABS: 1.1 10*3/uL (ref 0.7–4.0)
Lymphocytes Relative: 26.1 % (ref 12.0–46.0)
MCHC: 33.7 g/dL (ref 30.0–36.0)
MCV: 93.6 fl (ref 78.0–100.0)
MONO ABS: 0.4 10*3/uL (ref 0.1–1.0)
MONOS PCT: 9.7 % (ref 3.0–12.0)
NEUTROS PCT: 61.7 % (ref 43.0–77.0)
Neutro Abs: 2.7 10*3/uL (ref 1.4–7.7)
Platelets: 238 10*3/uL (ref 150.0–400.0)
RBC: 4.74 Mil/uL (ref 4.22–5.81)
RDW: 12.9 % (ref 11.5–15.5)
WBC: 4.4 10*3/uL (ref 4.0–10.5)

## 2016-04-05 LAB — BASIC METABOLIC PANEL
BUN: 17 mg/dL (ref 6–23)
CALCIUM: 9.3 mg/dL (ref 8.4–10.5)
CO2: 27 mEq/L (ref 19–32)
Chloride: 100 mEq/L (ref 96–112)
Creatinine, Ser: 0.98 mg/dL (ref 0.40–1.50)
GFR: 83.92 mL/min (ref >60.00)
Glucose, Bld: 87 mg/dL (ref 70–99)
POTASSIUM: 4.2 meq/L (ref 3.5–5.1)
Sodium: 133 mEq/L — ABNORMAL LOW (ref 135–145)

## 2016-04-05 LAB — LIPID PANEL
CHOLESTEROL: 159 mg/dL (ref 0–200)
HDL: 52 mg/dL (ref >39.00)
LDL Cholesterol: 99 mg/dL (ref 0–99)
NonHDL: 106.72
Total CHOL/HDL Ratio: 3
Triglycerides: 37 mg/dL (ref 0.0–149.0)
VLDL: 7.4 mg/dL (ref 0.0–40.0)

## 2016-04-05 LAB — POC URINALSYSI DIPSTICK (AUTOMATED)
Bilirubin, UA: NEGATIVE
GLUCOSE UA: NEGATIVE
Ketones, UA: NEGATIVE
LEUKOCYTES UA: NEGATIVE
Nitrite, UA: NEGATIVE
Protein, UA: NEGATIVE
Spec Grav, UA: 1.015 (ref 1.030–1.035)
UROBILINOGEN UA: 0.2 (ref ?–2.0)
pH, UA: 7 (ref 5.0–8.0)

## 2016-04-05 LAB — HEPATIC FUNCTION PANEL
ALBUMIN: 4.3 g/dL (ref 3.5–5.2)
ALT: 21 U/L (ref 0–53)
AST: 18 U/L (ref 0–37)
Alkaline Phosphatase: 55 U/L (ref 39–117)
Bilirubin, Direct: 0.1 mg/dL (ref 0.0–0.3)
Total Bilirubin: 0.7 mg/dL (ref 0.2–1.2)
Total Protein: 6.4 g/dL (ref 6.0–8.3)

## 2016-04-05 LAB — PSA: PSA: 0.38 ng/mL (ref 0.10–4.00)

## 2016-04-05 LAB — URINALYSIS, MICROSCOPIC ONLY

## 2016-04-05 LAB — TSH: TSH: 2.22 u[IU]/mL (ref 0.35–4.50)

## 2016-04-11 ENCOUNTER — Encounter: Payer: Self-pay | Admitting: Adult Health

## 2016-04-11 ENCOUNTER — Ambulatory Visit (INDEPENDENT_AMBULATORY_CARE_PROVIDER_SITE_OTHER): Payer: BLUE CROSS/BLUE SHIELD | Admitting: Adult Health

## 2016-04-11 VITALS — BP 120/68 | Temp 97.5°F | Wt 166.0 lb

## 2016-04-11 DIAGNOSIS — Z23 Encounter for immunization: Secondary | ICD-10-CM | POA: Diagnosis not present

## 2016-04-11 DIAGNOSIS — Z1159 Encounter for screening for other viral diseases: Secondary | ICD-10-CM

## 2016-04-11 DIAGNOSIS — Z0000 Encounter for general adult medical examination without abnormal findings: Secondary | ICD-10-CM | POA: Diagnosis not present

## 2016-04-11 NOTE — Patient Instructions (Signed)
Your exam and blood work were both excellent   Work on increasing the amount of exercise you do to help keep the heart strong   Follow up in one year or sooner if needed  Health Maintenance, Male A healthy lifestyle and preventative care can promote health and wellness.  Maintain regular health, dental, and eye exams.  Eat a healthy diet. Foods like vegetables, fruits, whole grains, low-fat dairy products, and lean protein foods contain the nutrients you need and are low in calories. Decrease your intake of foods high in solid fats, added sugars, and salt. Get information about a proper diet from your health care provider, if necessary.  Regular physical exercise is one of the most important things you can do for your health. Most adults should get at least 150 minutes of moderate-intensity exercise (any activity that increases your heart rate and causes you to sweat) each week. In addition, most adults need muscle-strengthening exercises on 2 or more days a week.   Maintain a healthy weight. The body mass index (BMI) is a screening tool to identify possible weight problems. It provides an estimate of body fat based on height and weight. Your health care provider can find your BMI and can help you achieve or maintain a healthy weight. For males 20 years and older:  A BMI below 18.5 is considered underweight.  A BMI of 18.5 to 24.9 is normal.  A BMI of 25 to 29.9 is considered overweight.  A BMI of 30 and above is considered obese.  Maintain normal blood lipids and cholesterol by exercising and minimizing your intake of saturated fat. Eat a balanced diet with plenty of fruits and vegetables. Blood tests for lipids and cholesterol should begin at age 79 and be repeated every 5 years. If your lipid or cholesterol levels are high, you are over age 59, or you are at high risk for heart disease, you may need your cholesterol levels checked more frequently.Ongoing high lipid and cholesterol  levels should be treated with medicines if diet and exercise are not working.  If you smoke, find out from your health care provider how to quit. If you do not use tobacco, do not start.  Lung cancer screening is recommended for adults aged 50-80 years who are at high risk for developing lung cancer because of a history of smoking. A yearly low-dose CT scan of the lungs is recommended for people who have at least a 30-pack-year history of smoking and are current smokers or have quit within the past 15 years. A pack year of smoking is smoking an average of 1 pack of cigarettes a day for 1 year (for example, a 30-pack-year history of smoking could mean smoking 1 pack a day for 30 years or 2 packs a day for 15 years). Yearly screening should continue until the smoker has stopped smoking for at least 15 years. Yearly screening should be stopped for people who develop a health problem that would prevent them from having lung cancer treatment.  If you choose to drink alcohol, do not have more than 2 drinks per day. One drink is considered to be 12 oz (360 mL) of beer, 5 oz (150 mL) of wine, or 1.5 oz (45 mL) of liquor.  Avoid the use of street drugs. Do not share needles with anyone. Ask for help if you need support or instructions about stopping the use of drugs.  High blood pressure causes heart disease and increases the risk of stroke. High blood  pressure is more likely to develop in:  People who have blood pressure in the end of the normal range (100-139/85-89 mm Hg).  People who are overweight or obese.  People who are African American.  If you are 27-59 years of age, have your blood pressure checked every 3-5 years. If you are 30 years of age or older, have your blood pressure checked every year. You should have your blood pressure measured twice--once when you are at a hospital or clinic, and once when you are not at a hospital or clinic. Record the average of the two measurements. To check your  blood pressure when you are not at a hospital or clinic, you can use:  An automated blood pressure machine at a pharmacy.  A home blood pressure monitor.  If you are 99-57 years old, ask your health care provider if you should take aspirin to prevent heart disease.  Diabetes screening involves taking a blood sample to check your fasting blood sugar level. This should be done once every 3 years after age 40 if you are at a normal weight and without risk factors for diabetes. Testing should be considered at a younger age or be carried out more frequently if you are overweight and have at least 1 risk factor for diabetes.  Colorectal cancer can be detected and often prevented. Most routine colorectal cancer screening begins at the age of 86 and continues through age 62. However, your health care provider may recommend screening at an earlier age if you have risk factors for colon cancer. On a yearly basis, your health care provider may provide home test kits to check for hidden blood in the stool. A small camera at the end of a tube may be used to directly examine the colon (sigmoidoscopy or colonoscopy) to detect the earliest forms of colorectal cancer. Talk to your health care provider about this at age 64 when routine screening begins. A direct exam of the colon should be repeated every 5-10 years through age 34, unless early forms of precancerous polyps or small growths are found.  People who are at an increased risk for hepatitis B should be screened for this virus. You are considered at high risk for hepatitis B if:  You were born in a country where hepatitis B occurs often. Talk with your health care provider about which countries are considered high risk.  Your parents were born in a high-risk country and you have not received a shot to protect against hepatitis B (hepatitis B vaccine).  You have HIV or AIDS.  You use needles to inject street drugs.  You live with, or have sex with,  someone who has hepatitis B.  You are a man who has sex with other men (MSM).  You get hemodialysis treatment.  You take certain medicines for conditions like cancer, organ transplantation, and autoimmune conditions.  Hepatitis C blood testing is recommended for all people born from [birth year] through 1966 and any individual with known risk factors for hepatitis C.  Healthy men should no longer receive prostate-specific antigen (PSA) blood tests as part of routine cancer screening. Talk to your health care provider about prostate cancer screening.  Testicular cancer screening is not recommended for adolescents or adult males who have no symptoms. Screening includes self-exam, a health care provider exam, and other screening tests. Consult with your health care provider about any symptoms you have or any concerns you have about testicular cancer.  Practice safe sex. Use condoms and avoid  high-risk sexual practices to reduce the spread of sexually transmitted infections (STIs).  You should be screened for STIs, including gonorrhea and chlamydia if:  You are sexually active and are younger than 24 years.  You are older than 24 years, and your health care provider tells you that you are at risk for this type of infection.  Your sexual activity has changed since you were last screened, and you are at an increased risk for chlamydia or gonorrhea. Ask your health care provider if you are at risk.  If you are at risk of being infected with HIV, it is recommended that you take a prescription medicine daily to prevent HIV infection. This is called pre-exposure prophylaxis (PrEP). You are considered at risk if:  You are a man who has sex with other men (MSM).  You are a heterosexual man who is sexually active with multiple partners.  You take drugs by injection.  You are sexually active with a partner who has HIV.  Talk with your health care provider about whether you are at high risk of being  infected with HIV. If you choose to begin PrEP, you should first be tested for HIV. You should then be tested every 3 months for as long as you are taking PrEP.  Use sunscreen. Apply sunscreen liberally and repeatedly throughout the day. You should seek shade when your shadow is shorter than you. Protect yourself by wearing long sleeves, pants, a wide-brimmed hat, and sunglasses year round whenever you are outdoors.  Tell your health care provider of new moles or changes in moles, especially if there is a change in shape or color. Also, tell your health care provider if a mole is larger than the size of a pencil eraser.  A one-time screening for abdominal aortic aneurysm (AAA) and surgical repair of large AAAs by ultrasound is recommended for men aged 14-75 years who are current or former smokers.  Stay current with your vaccines (immunizations).   This information is not intended to replace advice given to you by your health care provider. Make sure you discuss any questions you have with your health care provider.   Document Released: 07/06/2007 Document Revised: 01/28/2014 Document Reviewed: 06/04/2010 Elsevier Interactive Patient Education [employer].

## 2016-04-11 NOTE — Progress Notes (Signed)
Subjective:    Patient ID: Antonio Valdez, male    DOB: Mar 25, 1960, 56 y.o.   MRN: [id number]  HPI  Patient presents for yearly preventative medicine examination. He is a pleasant 57 year old male who  has a past medical history of ALLERGIC RHINITIS CAUSE UNSPECIFIED (09/05/2009); Allergy; HERPES LABIALIS (09/05/2009); and UNSPECIFIED TACHYCARDIA (09/05/2009).    All immunizations and health maintenance protocols were reviewed with the patient and needed orders were placed. He is up to date on vaccinations   Medication reconciliation,  past medical history, social history, problem list and allergies were reviewed in detail with the patient  Goals were established with regard to weight loss, exercise, and  diet in compliance with medications. He eats healthy but does not exercise on a regular basis due to travel   He has had his colonoscopy within the last year. He is up to date on his vision and dental screens   He takes Claritin and Nasocort for seasonal allergies.   Has not had any interval history   He has no acute complaints today. He would like Hep C screening    Review of Systems  Constitutional: Negative.   HENT: Negative.   Eyes: Negative.   Respiratory: Negative.   Cardiovascular: Negative.   Gastrointestinal: Negative.   Endocrine: Negative.   Genitourinary: Negative.   Musculoskeletal: Negative.   Skin: Negative.   Allergic/Immunologic: Negative.   Neurological: Negative.   Hematological: Negative.   Psychiatric/Behavioral: Negative.   All other systems reviewed and are negative.  Past Medical History:  Diagnosis Date  . ALLERGIC RHINITIS CAUSE UNSPECIFIED 09/05/2009  . Allergy   . HERPES LABIALIS 09/05/2009  . UNSPECIFIED TACHYCARDIA 09/05/2009    Social History   Social History  . Marital status: Married    Spouse name: N/A  . Number of children: N/A  . Years of education: N/A   Occupational History  . Not on file.   Social History Main Topics  .  Smoking status: Never Smoker  . Smokeless tobacco: Never Used  . Alcohol use 0.0 oz/week     Comment: occ.   . Drug use: No  . Sexual activity: Not on file   Other Topics Concern  . Not on file   Social History Narrative   Works in [employer].    Married with no kids. Married for 11 years.           Past Surgical History:  Procedure Laterality Date  . COLONOSCOPY    . HERNIA REPAIR    . SPINE SURGERY      Family History  Problem Relation Age of Onset  . Renal cancer Father   . Colon polyps Father   . Kidney cancer Father   . Arthritis Mother   . Hyperlipidemia Mother   . Colon cancer Paternal Aunt   . Esophageal cancer Neg Hx   . Stomach cancer Neg Hx   . Rectal cancer Neg Hx     No Known Allergies  Current Outpatient Prescriptions on File Prior to Visit  Medication Sig Dispense Refill  . loratadine (CLARITIN) 10 MG tablet Take 10 mg by mouth daily.    Marland Kitchen triamcinolone (NASACORT ALLERGY 24HR) 55 MCG/ACT AERO nasal inhaler Place 2 sprays into the nose daily. Takes only seasonally     No current facility-administered medications on file prior to visit.     BP 120/68 (BP Location: Left Arm, Patient Position: Sitting, Cuff Size: Normal)   Temp 97.5 F (36.4 C) (  Oral)   Wt 166 lb (75.3 kg)   BMI 24.51 kg/m       Objective:   Physical Exam  Constitutional: He is oriented to person, place, and time. He appears well-developed and well-nourished. No distress.  HENT:  Head: Normocephalic and atraumatic.  Right Ear: External ear normal.  Left Ear: External ear normal.  Nose: Nose normal.  Mouth/Throat: Oropharynx is clear and moist. No oropharyngeal exudate.  Eyes: Conjunctivae and EOM are normal. Pupils are equal, round, and reactive to light. Right eye exhibits no discharge. Left eye exhibits no discharge. No scleral icterus.  Neck: Trachea normal and normal range of motion. Neck supple. No JVD present. Carotid bruit is not present. No tracheal deviation present. No  thyroid mass and no thyromegaly present.  Cardiovascular: Normal rate, regular rhythm, normal heart sounds and intact distal pulses.  Exam reveals no gallop and no friction rub.   No murmur heard. Pulmonary/Chest: Effort normal and breath sounds normal. No stridor. No respiratory distress. He has no wheezes. He has no rales. He exhibits no tenderness.  Abdominal: Soft. Bowel sounds are normal. He exhibits no distension and no mass. There is no tenderness. There is no rebound and no guarding.  Genitourinary: Prostate normal.  Musculoskeletal: Normal range of motion. He exhibits no edema, tenderness or deformity.  Lymphadenopathy:    He has no cervical adenopathy.  Neurological: He is alert and oriented to person, place, and time. He has normal reflexes. He displays normal reflexes. No cranial nerve deficit. He exhibits normal muscle tone. Coordination normal.  Skin: Skin is warm and dry. No rash noted. He is not diaphoretic. No erythema. No pallor.  Scattered moles and AK's on torso  Psychiatric: He has a normal mood and affect. His behavior is normal. Judgment and thought content normal.  Nursing note and vitals reviewed.     Assessment & Plan:  1. Routine general medical examination at a health care facility - Reviewed labs with patient and all questions answered - Benign exam  - Continue to eat healthy  - Work on increasing exercise into regimen  - Follow up in one year or sooner if needed  2. Need for hepatitis C screening test  - Hep C Antibody  3. Need for Tdap vaccination - Tdap vaccine greater than or equal to 7yo IM  Dorothyann Peng, NP

## 2016-04-12 LAB — HEPATITIS C ANTIBODY: HCV Ab: NEGATIVE

## 2016-08-02 ENCOUNTER — Ambulatory Visit (INDEPENDENT_AMBULATORY_CARE_PROVIDER_SITE_OTHER): Payer: BLUE CROSS/BLUE SHIELD | Admitting: Adult Health

## 2016-08-02 ENCOUNTER — Telehealth: Payer: Self-pay | Admitting: Adult Health

## 2016-08-02 ENCOUNTER — Encounter: Payer: Self-pay | Admitting: Adult Health

## 2016-08-02 ENCOUNTER — Other Ambulatory Visit: Payer: Self-pay | Admitting: Adult Health

## 2016-08-02 VITALS — BP 100/72 | HR 101 | Temp 98.2°F | Ht 69.0 in | Wt 164.2 lb

## 2016-08-02 DIAGNOSIS — J0140 Acute pansinusitis, unspecified: Secondary | ICD-10-CM | POA: Diagnosis not present

## 2016-08-02 MED ORDER — METHYLPREDNISOLONE 4 MG PO TBPK: ORAL_TABLET | ORAL | 0 refills | Status: DC

## 2016-08-02 MED ORDER — DOXYCYCLINE HYCLATE 100 MG PO CAPS: 100.0000 mg | ORAL_CAPSULE | Freq: Two times a day (BID) | ORAL | 0 refills | Status: DC

## 2016-08-02 NOTE — Progress Notes (Signed)
Subjective:    Patient ID: Antonio Valdez, male    DOB: 06-Mar-1960, 56 y.o.   MRN: [id number]  URI   This is a new problem. The current episode started 1 to 4 weeks ago. The problem has been gradually worsening. There has been no fever. Associated symptoms include congestion, ear pain, headaches, rhinorrhea, sinus pain and swollen glands. Pertinent negatives include no coughing or nausea. He has tried decongestant and NSAIDs for the symptoms. The treatment provided mild relief.    Review of Systems  Constitutional: Positive for fatigue. Negative for activity change, appetite change and fever.  HENT: Positive for congestion, ear pain, rhinorrhea, sinus pain and sinus pressure. Negative for ear discharge.   Respiratory: Negative for cough.   Cardiovascular: Negative.   Gastrointestinal: Negative for nausea.  Neurological: Positive for headaches.   Past Medical History:  Diagnosis Date  . ALLERGIC RHINITIS CAUSE UNSPECIFIED 09/05/2009  . Allergy   . HERPES LABIALIS 09/05/2009  . UNSPECIFIED TACHYCARDIA 09/05/2009    Social History   Social History  . Marital status: Married    Spouse name: N/A  . Number of children: N/A  . Years of education: N/A   Occupational History  . Not on file.   Social History Main Topics  . Smoking status: Never Smoker  . Smokeless tobacco: Never Used  . Alcohol use 0.0 oz/week     Comment: occ.   . Drug use: No  . Sexual activity: Not on file   Other Topics Concern  . Not on file   Social History Narrative   Works in [employer].    Married with no kids. Married for 11 years.           Past Surgical History:  Procedure Laterality Date  . COLONOSCOPY    . HERNIA REPAIR    . SPINE SURGERY      Family History  Problem Relation Age of Onset  . Renal cancer Father   . Colon polyps Father   . Kidney cancer Father   . Arthritis Mother   . Hyperlipidemia Mother   . Colon cancer Paternal Aunt   . Esophageal cancer Neg Hx   . Stomach cancer  Neg Hx   . Rectal cancer Neg Hx     No Known Allergies  Current Outpatient Prescriptions on File Prior to Visit  Medication Sig Dispense Refill  . loratadine (CLARITIN) 10 MG tablet Take 10 mg by mouth daily.    Marland Kitchen triamcinolone (NASACORT ALLERGY 24HR) 55 MCG/ACT AERO nasal inhaler Place 2 sprays into the nose daily. Takes only seasonally     No current facility-administered medications on file prior to visit.     BP 100/72   Pulse (!) 101   Temp 98.2 F (36.8 C) (Oral)   Ht 5\' 9"  (1.753 m)   Wt 164 lb 3.2 oz (74.5 kg)   BMI 24.25 kg/m       Objective:   Physical Exam  Constitutional: He is oriented to person, place, and time. He appears well-developed and well-nourished. No distress.  HENT:  Head: Normocephalic and atraumatic.  Right Ear: Hearing, external ear and ear canal normal. Tympanic membrane is bulging. Tympanic membrane is not erythematous.  Left Ear: Hearing, external ear and ear canal normal. Tympanic membrane is bulging. Tympanic membrane is not erythematous.  Nose: Mucosal edema and rhinorrhea present. Right sinus exhibits maxillary sinus tenderness and frontal sinus tenderness. Left sinus exhibits maxillary sinus tenderness and frontal sinus tenderness.  Mouth/Throat: Uvula  is midline, oropharynx is clear and moist and mucous membranes are normal. No oropharyngeal exudate.  Cardiovascular: Normal rate, regular rhythm, normal heart sounds and intact distal pulses.  Exam reveals no gallop and no friction rub.   No murmur heard. Pulmonary/Chest: Effort normal and breath sounds normal. No respiratory distress. He has no wheezes. He has no rales. He exhibits no tenderness.  Neurological: He is alert and oriented to person, place, and time.  Skin: Skin is warm and dry. No rash noted. He is not diaphoretic. No erythema. No pallor.  Psychiatric: He has a normal mood and affect. His behavior is normal. Judgment and thought content normal.  Nursing note and vitals  reviewed.     Assessment & Plan:  1. Acute non-recurrent pansinusitis - Stop OTC decongestants. Use Nasocort. Stay hydrated and rest.  - doxycycline (VIBRAMYCIN) 100 MG capsule; Take 1 capsule (100 mg total) by mouth 2 (two) times daily.  Dispense: 14 capsule; Refill: 0 - Follow up if no improvement in the next 2-3 days   Dorothyann Peng, NP

## 2016-08-02 NOTE — Telephone Encounter (Signed)
Pt needs new rx prednisone. Pt decline med earlier. [facility]

## 2016-08-13 ENCOUNTER — Ambulatory Visit (INDEPENDENT_AMBULATORY_CARE_PROVIDER_SITE_OTHER): Payer: BLUE CROSS/BLUE SHIELD | Admitting: Adult Health

## 2016-08-13 ENCOUNTER — Encounter: Payer: Self-pay | Admitting: Adult Health

## 2016-08-13 VITALS — BP 102/64 | HR 79 | Temp 97.9°F | Ht 69.0 in | Wt 161.4 lb

## 2016-08-13 DIAGNOSIS — J0141 Acute recurrent pansinusitis: Secondary | ICD-10-CM

## 2016-08-13 MED ORDER — AMOXICILLIN-POT CLAVULANATE 875-125 MG PO TABS: 1.0000 | ORAL_TABLET | Freq: Two times a day (BID) | ORAL | 0 refills | Status: DC

## 2016-08-13 NOTE — Progress Notes (Signed)
Subjective:    Patient ID: Antonio Valdez, male    DOB: 1960-09-17, 56 y.o.   MRN: [id number]  Was seen 08/02/2016 for sinusitis and prescribed a 7 day course of doxycycline. He reports slight improvement but continues to feel as though he continues to have a sinus infection. Today he feels as little better today than yesterday    Sinusitis  This is a new problem. The current episode started 1 to 4 weeks ago. The problem has been gradually improving since onset. There has been no fever. Associated symptoms include congestion, headaches and sinus pressure. Pertinent negatives include no chills, coughing, shortness of breath or sore throat. Past treatments include antibiotics.      Review of Systems  Constitutional: Positive for activity change and fatigue. Negative for chills and fever.  HENT: Positive for congestion, rhinorrhea, sinus pain and sinus pressure. Negative for sore throat.   Respiratory: Negative.  Negative for cough and shortness of breath.   Cardiovascular: Negative.   Neurological: Positive for headaches.   Past Medical History:  Diagnosis Date  . ALLERGIC RHINITIS CAUSE UNSPECIFIED 09/05/2009  . Allergy   . HERPES LABIALIS 09/05/2009  . UNSPECIFIED TACHYCARDIA 09/05/2009    Social History   Social History  . Marital status: Married    Spouse name: N/A  . Number of children: N/A  . Years of education: N/A   Occupational History  . Not on file.   Social History Main Topics  . Smoking status: Never Smoker  . Smokeless tobacco: Never Used  . Alcohol use 0.0 oz/week     Comment: occ.   . Drug use: No  . Sexual activity: Not on file   Other Topics Concern  . Not on file   Social History Narrative   Works in [employer].    Married with no kids. Married for 11 years.           Past Surgical History:  Procedure Laterality Date  . COLONOSCOPY    . HERNIA REPAIR    . SPINE SURGERY      Family History  Problem Relation Age of Onset  . Renal cancer Father    . Colon polyps Father   . Kidney cancer Father   . Arthritis Mother   . Hyperlipidemia Mother   . Colon cancer Paternal Aunt   . Esophageal cancer Neg Hx   . Stomach cancer Neg Hx   . Rectal cancer Neg Hx     No Known Allergies  Current Outpatient Prescriptions on File Prior to Visit  Medication Sig Dispense Refill  . loratadine (CLARITIN) 10 MG tablet Take 10 mg by mouth daily.     No current facility-administered medications on file prior to visit.     BP 102/64 (BP Location: Left Arm, Patient Position: Sitting, Cuff Size: Normal)   Pulse 79   Temp 97.9 F (36.6 C) (Oral)   Ht 5\' 9"  (1.753 m)   Wt 161 lb 6.4 oz (73.2 kg)   SpO2 99%   BMI 23.83 kg/m       Objective:   Physical Exam  Constitutional: He is oriented to person, place, and time. He appears well-developed and well-nourished. He appears ill. No distress.  HENT:  Head: Normocephalic and atraumatic.  Right Ear: Hearing, external ear and ear canal normal. Tympanic membrane is not bulging.  Left Ear: Hearing, external ear and ear canal normal. Tympanic membrane is not bulging.  Nose: Mucosal edema and rhinorrhea present. Right sinus exhibits maxillary  sinus tenderness and frontal sinus tenderness. Left sinus exhibits maxillary sinus tenderness and frontal sinus tenderness.  Mouth/Throat: Oropharynx is clear and moist and mucous membranes are normal. No oropharyngeal exudate.  Cardiovascular: Normal rate, regular rhythm, normal heart sounds and intact distal pulses.  Exam reveals no gallop and no friction rub.   No murmur heard. Pulmonary/Chest: Effort normal and breath sounds normal. No respiratory distress. He has no wheezes. He has no rales.  Neurological: He is alert and oriented to person, place, and time.  Skin: Skin is warm and dry. No rash noted. He is not diaphoretic. No erythema. No pallor.  Psychiatric: He has a normal mood and affect. His behavior is normal. Judgment and thought content normal.    Nursing note and vitals reviewed.      Assessment & Plan:  1. Acute recurrent pansinusitis - Will prescribe Augmentin course. He will wait for 2-3 days to see if he continues to improve  - amoxicillin-clavulanate (AUGMENTIN) 875-125 MG tablet; Take 1 tablet by mouth 2 (two) times daily.  Dispense: 14 tablet; Refill: 0   Dorothyann Peng, NP

## 2017-04-15 ENCOUNTER — Ambulatory Visit (INDEPENDENT_AMBULATORY_CARE_PROVIDER_SITE_OTHER): Payer: BLUE CROSS/BLUE SHIELD | Admitting: Adult Health

## 2017-04-15 ENCOUNTER — Encounter: Payer: BLUE CROSS/BLUE SHIELD | Admitting: Adult Health

## 2017-04-15 ENCOUNTER — Encounter: Payer: Self-pay | Admitting: Adult Health

## 2017-04-15 VITALS — BP 98/64 | Temp 98.2°F | Ht 68.25 in | Wt 160.0 lb

## 2017-04-15 DIAGNOSIS — E559 Vitamin D deficiency, unspecified: Secondary | ICD-10-CM

## 2017-04-15 DIAGNOSIS — Z125 Encounter for screening for malignant neoplasm of prostate: Secondary | ICD-10-CM

## 2017-04-15 DIAGNOSIS — J302 Other seasonal allergic rhinitis: Secondary | ICD-10-CM | POA: Diagnosis not present

## 2017-04-15 DIAGNOSIS — Z0000 Encounter for general adult medical examination without abnormal findings: Secondary | ICD-10-CM

## 2017-04-15 DIAGNOSIS — R5383 Other fatigue: Secondary | ICD-10-CM

## 2017-04-15 LAB — COMPREHENSIVE METABOLIC PANEL
ALT: 27 U/L (ref 0–53)
AST: 16 U/L (ref 0–37)
Albumin: 4.3 g/dL (ref 3.5–5.2)
Alkaline Phosphatase: 53 U/L (ref 39–117)
BUN: 19 mg/dL (ref 6–23)
CO2: 31 mEq/L (ref 19–32)
Calcium: 9.2 mg/dL (ref 8.4–10.5)
Chloride: 102 mEq/L (ref 96–112)
Creatinine, Ser: 0.93 mg/dL (ref 0.40–1.50)
GFR: 88.82 mL/min (ref >60.00)
Glucose, Bld: 102 mg/dL — ABNORMAL HIGH (ref 70–99)
Potassium: 3.9 mEq/L (ref 3.5–5.1)
Sodium: 139 mEq/L (ref 135–145)
Total Bilirubin: 0.7 mg/dL (ref 0.2–1.2)
Total Protein: 6.6 g/dL (ref 6.0–8.3)

## 2017-04-15 LAB — LIPID PANEL
Cholesterol: 194 mg/dL (ref 0–200)
HDL: 48.4 mg/dL (ref >39.00)
LDL Cholesterol: 133 mg/dL — ABNORMAL HIGH (ref 0–99)
NonHDL: 145.62
Total CHOL/HDL Ratio: 4
Triglycerides: 62 mg/dL (ref 0.0–149.0)
VLDL: 12.4 mg/dL (ref 0.0–40.0)

## 2017-04-15 LAB — CBC WITH DIFFERENTIAL/PLATELET
BASOS PCT: 1 % (ref 0.0–3.0)
Basophils Absolute: 0 10*3/uL (ref 0.0–0.1)
EOS PCT: 2.7 % (ref 0.0–5.0)
Eosinophils Absolute: 0.1 10*3/uL (ref 0.0–0.7)
HCT: 44.3 % (ref 39.0–52.0)
Hemoglobin: 15.3 g/dL (ref 13.0–17.0)
LYMPHS ABS: 1.1 10*3/uL (ref 0.7–4.0)
Lymphocytes Relative: 27.5 % (ref 12.0–46.0)
MCHC: 34.5 g/dL (ref 30.0–36.0)
MCV: 92.9 fl (ref 78.0–100.0)
MONO ABS: 0.4 10*3/uL (ref 0.1–1.0)
Monocytes Relative: 10.4 % (ref 3.0–12.0)
NEUTROS PCT: 58.4 % (ref 43.0–77.0)
Neutro Abs: 2.4 10*3/uL (ref 1.4–7.7)
Platelets: 231 10*3/uL (ref 150.0–400.0)
RBC: 4.77 Mil/uL (ref 4.22–5.81)
RDW: 12.7 % (ref 11.5–15.5)
WBC: 4 10*3/uL (ref 4.0–10.5)

## 2017-04-15 LAB — VITAMIN D 25 HYDROXY (VIT D DEFICIENCY, FRACTURES): VITD: 42.09 ng/mL (ref 30.00–100.00)

## 2017-04-15 LAB — PSA: PSA: 0.47 ng/mL (ref 0.10–4.00)

## 2017-04-15 LAB — TSH: TSH: 3.85 u[IU]/mL (ref 0.35–4.50)

## 2017-04-15 NOTE — Progress Notes (Addendum)
Subjective:    Patient ID: Antonio Valdez, male    DOB: Aug 05, 1960, 57 y.o.   MRN: [id number]  HPI  Patient presents for yearly preventative medicine examination. He is a pleasant 57 year old male who  has a past medical history of ALLERGIC RHINITIS CAUSE UNSPECIFIED (09/05/2009), HERPES LABIALIS (09/05/2009), and UNSPECIFIED TACHYCARDIA (09/05/2009).  All immunizations and health maintenance protocols were reviewed with the patient and needed orders were placed. He is UTD on vaccinations   Appropriate screening laboratory values were ordered for the patient including screening of hyperlipidemia, renal function and hepatic function. If indicated by BPH, a PSA was ordered.  Medication reconciliation,  past medical history, social history, problem list and allergies were reviewed in detail with the patient  Goals were established with regard to weight loss, exercise, and  diet in compliance with medications.  He eats a heart healthy diet but does not exercise on a regular basis due to travel for work  End of life planning was discussed.  He is up-to-date on routine health maintenance exam such as colonoscopy, dental and vision screens.  He takes claritin PRN for seasonal allergies.   He denies any interval history  His biggest complaint is that of chronic fatigue that has been becoming progressively worse. He reports that he does not feel like he has any issued with ED. Not exercising but eating healthy. He would like to have his testosterone levels checked   Review of Systems  Constitutional: Positive for fatigue.  HENT: Negative.   Eyes: Negative.   Respiratory: Negative.   Cardiovascular: Negative.   Gastrointestinal: Negative.   Endocrine: Negative.   Genitourinary: Negative.   Musculoskeletal: Negative.   Skin: Negative.   Allergic/Immunologic: Negative.   Neurological: Negative.   Hematological: Negative.   Psychiatric/Behavioral: Negative.   All other systems reviewed  and are negative.  Past Medical History:  Diagnosis Date  . ALLERGIC RHINITIS CAUSE UNSPECIFIED 09/05/2009  . HERPES LABIALIS 09/05/2009  . UNSPECIFIED TACHYCARDIA 09/05/2009    Social History   Socioeconomic History  . Marital status: Married    Spouse name: Not on file  . Number of children: Not on file  . Years of education: Not on file  . Highest education level: Not on file  Occupational History  . Not on file  Social Needs  . Financial resource strain: Not on file  . Food insecurity:    Worry: Not on file    Inability: Not on file  . Transportation needs:    Medical: Not on file    Non-medical: Not on file  Tobacco Use  . Smoking status: Never Smoker  . Smokeless tobacco: Never Used  Substance and Sexual Activity  . Alcohol use: Yes    Alcohol/week: 0.0 oz    Comment: occ.   . Drug use: No  . Sexual activity: Not on file  Lifestyle  . Physical activity:    Days per week: Not on file    Minutes per session: Not on file  . Stress: Not on file  Relationships  . Social connections:    Talks on phone: Not on file    Gets together: Not on file    Attends religious service: Not on file    Active member of club or organization: Not on file    Attends meetings of clubs or organizations: Not on file    Relationship status: Not on file  . Intimate partner violence:    Fear of current or ex  partner: Not on file    Emotionally abused: Not on file    Physically abused: Not on file    Forced sexual activity: Not on file  Other Topics Concern  . Not on file  Social History Narrative   Works in [employer].    Married with no kids. Married for 11 years.        Past Surgical History:  Procedure Laterality Date  . COLONOSCOPY    . HERNIA REPAIR    . SPINE SURGERY      Family History  Problem Relation Age of Onset  . Renal cancer Father   . Colon polyps Father   . Kidney cancer Father   . Arthritis Mother   . Hyperlipidemia Mother   . Colon cancer Paternal Aunt     . Esophageal cancer Neg Hx   . Stomach cancer Neg Hx   . Rectal cancer Neg Hx     No Known Allergies  Current Outpatient Medications on File Prior to Visit  Medication Sig Dispense Refill  . Ascorbic Acid (VITAMIN C PO) Take by mouth.    . Cholecalciferol (VITAMIN D-3) 5000 units TABS Take 1 tablet by mouth daily.    . Multiple Vitamin (MULTIVITAMIN) tablet Take 1 tablet by mouth daily.    . Omega-3 Fatty Acids (FISH OIL PO) Take by mouth.    Marland Kitchen VITAMIN E PO Take by mouth.    . loratadine (CLARITIN) 10 MG tablet Take 10 mg by mouth daily.     No current facility-administered medications on file prior to visit.     BP 98/64 (BP Location: Right Leg)   Temp 98.2 F (36.8 C) (Oral)   Ht 5' 8.25" (1.734 m)   Wt 160 lb (72.6 kg)   BMI 24.15 kg/m       Objective:   Physical Exam  Constitutional: He is oriented to person, place, and time. He appears well-developed and well-nourished. No distress.  HENT:  Head: Normocephalic and atraumatic.  Right Ear: External ear normal.  Left Ear: External ear normal.  Nose: Nose normal.  Mouth/Throat: Oropharynx is clear and moist. No oropharyngeal exudate.  Eyes: Pupils are equal, round, and reactive to light. Conjunctivae and EOM are normal. Right eye exhibits no discharge. Left eye exhibits no discharge. No scleral icterus.  Neck: Normal range of motion. Neck supple. No JVD present. No tracheal deviation present. No thyromegaly present.  Cardiovascular: Normal rate, regular rhythm, normal heart sounds and intact distal pulses. Exam reveals no gallop and no friction rub.  No murmur heard. Pulmonary/Chest: Effort normal and breath sounds normal. No stridor. No respiratory distress. He has no wheezes. He has no rales. He exhibits no tenderness.  Abdominal: Soft. Bowel sounds are normal. He exhibits no distension and no mass. There is no tenderness. There is no rebound and no guarding.  Genitourinary: Rectum normal and prostate normal. Rectal  exam shows guaiac negative stool.  Musculoskeletal: Normal range of motion. He exhibits no edema, tenderness or deformity.  Lymphadenopathy:    He has no cervical adenopathy.  Neurological: He is alert and oriented to person, place, and time. He has normal reflexes. He displays normal reflexes. No cranial nerve deficit. He exhibits normal muscle tone. Coordination normal.  Skin: Skin is warm and dry. No rash noted. He is not diaphoretic. No erythema. No pallor.  Psychiatric: He has a normal mood and affect. His behavior is normal. Judgment and thought content normal.  Nursing note and vitals reviewed.  Assessment & Plan:  1. Routine general medical examination at a health care facility - I would like him to start exercising.  - Continue to eat healthy  - CBC with Differential/Platelet - Comprehensive metabolic panel - Lipid panel - TSH  2. Seasonal allergies - Continue with current treatment   3. Fatigue, unspecified type  - TSH - Vitamin D, 25-hydroxy - Testosterone Total,Free,Bio, Males - Consider referral to Urology   4. Prostate cancer screening  - PSA  Dorothyann Peng, AGNP

## 2017-04-17 LAB — TESTOSTERONE TOTAL,FREE,BIO, MALES
ALBUMIN MSPROF: 4.5 g/dL (ref 3.6–5.1)
SEX HORMONE BINDING: 44 nmol/L (ref 22–77)
TESTOSTERONE BIOAVAILABLE: 99.5 ng/dL — AB (ref >=110.0)
TESTOSTERONE: 464 ng/dL (ref 250–827)
Testosterone, Free: 48.4 pg/mL (ref 46.0–224.0)

## 2017-06-03 ENCOUNTER — Ambulatory Visit (INDEPENDENT_AMBULATORY_CARE_PROVIDER_SITE_OTHER): Payer: BLUE CROSS/BLUE SHIELD | Admitting: Adult Health

## 2017-06-03 ENCOUNTER — Encounter: Payer: Self-pay | Admitting: Adult Health

## 2017-06-03 VITALS — BP 160/60 | Temp 97.8°F | Wt 164.0 lb

## 2017-06-03 DIAGNOSIS — W57XXXA Bitten or stung by nonvenomous insect and other nonvenomous arthropods, initial encounter: Secondary | ICD-10-CM | POA: Diagnosis not present

## 2017-06-03 DIAGNOSIS — S80862A Insect bite (nonvenomous), left lower leg, initial encounter: Secondary | ICD-10-CM

## 2017-06-03 MED ORDER — DOXYCYCLINE HYCLATE 100 MG PO CAPS: 100.0000 mg | ORAL_CAPSULE | Freq: Two times a day (BID) | ORAL | 0 refills | Status: DC

## 2017-06-03 NOTE — Progress Notes (Signed)
Subjective:    Patient ID: Antonio Valdez, male    DOB: August 02, 1960, 57 y.o.   MRN: [id number]  HPI  57 year old male who  has a past medical history of ALLERGIC RHINITIS CAUSE UNSPECIFIED (09/05/2009), HERPES LABIALIS (09/05/2009), and UNSPECIFIED TACHYCARDIA (09/05/2009).  Presents to the office today with the acute complaint of tick bite to left hip. First noticed three days ago and was removed in entirety on the day it was noticed. He does not know how long it was attached for .  Report localized redness and irritation. No bullseye rash, fevers, joint or body aches.   Review of Systems See HPI   Past Medical History:  Diagnosis Date  . ALLERGIC RHINITIS CAUSE UNSPECIFIED 09/05/2009  . HERPES LABIALIS 09/05/2009  . UNSPECIFIED TACHYCARDIA 09/05/2009    Social History   Socioeconomic History  . Marital status: Married    Spouse name: Not on file  . Number of children: Not on file  . Years of education: Not on file  . Highest education level: Not on file  Occupational History  . Not on file  Social Needs  . Financial resource strain: Not on file  . Food insecurity:    Worry: Not on file    Inability: Not on file  . Transportation needs:    Medical: Not on file    Non-medical: Not on file  Tobacco Use  . Smoking status: Never Smoker  . Smokeless tobacco: Never Used  Substance and Sexual Activity  . Alcohol use: Yes    Alcohol/week: 0.0 oz    Comment: occ.   . Drug use: No  . Sexual activity: Not on file  Lifestyle  . Physical activity:    Days per week: Not on file    Minutes per session: Not on file  . Stress: Not on file  Relationships  . Social connections:    Talks on phone: Not on file    Gets together: Not on file    Attends religious service: Not on file    Active member of club or organization: Not on file    Attends meetings of clubs or organizations: Not on file    Relationship status: Not on file  . Intimate partner violence:    Fear of current or  ex partner: Not on file    Emotionally abused: Not on file    Physically abused: Not on file    Forced sexual activity: Not on file  Other Topics Concern  . Not on file  Social History Narrative   Works in [employer].    Married with no kids. Married for 11 years.        Past Surgical History:  Procedure Laterality Date  . COLONOSCOPY    . HERNIA REPAIR    . SPINE SURGERY      Family History  Problem Relation Age of Onset  . Renal cancer Father   . Colon polyps Father   . Kidney cancer Father   . Arthritis Mother   . Hyperlipidemia Mother   . Colon cancer Paternal Aunt   . Esophageal cancer Neg Hx   . Stomach cancer Neg Hx   . Rectal cancer Neg Hx     No Known Allergies  Current Outpatient Medications on File Prior to Visit  Medication Sig Dispense Refill  . Ascorbic Acid (VITAMIN C PO) Take by mouth.    . Cholecalciferol (VITAMIN D-3) 5000 units TABS Take 1 tablet by mouth daily.    Marland Kitchen  loratadine (CLARITIN) 10 MG tablet Take 10 mg by mouth daily.    . Multiple Vitamin (MULTIVITAMIN) tablet Take 1 tablet by mouth daily.    . Omega-3 Fatty Acids (FISH OIL PO) Take by mouth.    Marland Kitchen VITAMIN E PO Take by mouth.     No current facility-administered medications on file prior to visit.     BP (!) 160/60   Temp 97.8 F (36.6 C) (Oral)   Wt 164 lb (74.4 kg)   BMI 24.75 kg/m       Objective:   Physical Exam  Constitutional: He appears well-developed and well-nourished. No distress.  Cardiovascular: Normal rate, regular rhythm, normal heart sounds and intact distal pulses. Exam reveals no gallop and no friction rub.  No murmur heard. Pulmonary/Chest: Effort normal and breath sounds normal. No stridor. No respiratory distress. He has no wheezes. He has no rales. He exhibits no tenderness.  Skin: Skin is warm. Capillary refill takes less than 2 seconds. Rash noted. He is not diaphoretic.  No signs of tick imbedded in wound. Localized redness and small dime sized non fluctuant  abscess noted under wound.       Psychiatric: He has a normal mood and affect. His behavior is normal. Judgment and thought content normal.  Nursing note and vitals reviewed.     Assessment & Plan:  1. Tick bite of left lower leg, initial encounter - no signs of tick borne illness but will cover with doxycycline and for abscess  - doxycycline (VIBRAMYCIN) 100 MG capsule; Take 1 capsule (100 mg total) by mouth 2 (two) times daily.  Dispense: 14 capsule; Refill: 0  Dorothyann Peng, NP

## 2017-06-04 ENCOUNTER — Encounter: Payer: Self-pay | Admitting: Adult Health

## 2017-06-04 ENCOUNTER — Ambulatory Visit (INDEPENDENT_AMBULATORY_CARE_PROVIDER_SITE_OTHER): Payer: BLUE CROSS/BLUE SHIELD | Admitting: Adult Health

## 2017-06-04 ENCOUNTER — Ambulatory Visit: Payer: Self-pay | Admitting: *Deleted

## 2017-06-04 VITALS — BP 102/66 | Temp 97.9°F | Wt 165.0 lb

## 2017-06-04 DIAGNOSIS — L0291 Cutaneous abscess, unspecified: Secondary | ICD-10-CM | POA: Diagnosis not present

## 2017-06-04 NOTE — Telephone Encounter (Signed)
Pt is here now in the office to see provider [facility].

## 2017-06-04 NOTE — Telephone Encounter (Signed)
Pt states he was seen in the office on yesterday with Dorothyann Peng, NP for treatment of a tick bite and was prescribed Doxycycline. Pt states he took one dose last night and one dose this morning at approximately 8am. Pt states he noted that a knot appeared about 30 min after taking the medication this morning between the left eye and ear. Pt describes the knot as being hard, round and about the size of a quarter with a little redness.  Pt has denies any pain or itching and states that this is the only area where a knot appeared. Pt denies dizziness, SOB, nausea, diarrhea or other symptoms.Pt concerned that this may be allergic reaction to Doxyclycline. Exlpained to pt that typically with  allergic reactions a rash would occur to to other areas as well and the pt he may experience  other symptoms.Pt states he is going out of town this is week and is requesting to have appt to have some one to look at the area of concern. Appt schedulded today with Janna Arch at 1:15pm. Reason for Disposition . [1] Small swelling or lump AND [2] unexplained AND [3] present < 1 week  Answer Assessment - Initial Assessment Questions 1. APPEARANCE of SWELLING: "What does it look like?" (e.g., lymph node, insect bite, mole)     Pt states it does not look like a lymph node but the area is round and hard 2. SIZE: "How large is the swelling?" (inches, cm or compare to coins)     Quarter sized 3. LOCATION: "Where is the swelling located?"      Between eye and ear on the left side 4. ONSET: "When did the swelling start?"     This morning 5. PAIN: "Is it painful?" If so, ask: "How much?"     No 6. ITCH: "Does it itch?" If so, ask: "How much?"     No 7. CAUSE: "What do you think caused the swelling?"     Recently started on Doxycycline after a tick bite 8. OTHER SYMPTOMS: "Do you have any other symptoms?" (e.g., fever)     No other symptoms at this time  Protocols used: SKIN LUMP OR LOCALIZED SWELLING-A-AH

## 2017-06-04 NOTE — Progress Notes (Signed)
Subjective:    Patient ID: Antonio Valdez, male    DOB: September 16, 1960, 57 y.o.   MRN: [id number]  HPI 57 year old male who presents to the office today for an acute issue of " mass" on left side of face that he first noticed earlier today. Did not notice anything last night before he went to bed. Denies any pain but states " it feels tight"   Review of Systems See HPI   Past Medical History:  Diagnosis Date  . ALLERGIC RHINITIS CAUSE UNSPECIFIED 09/05/2009  . HERPES LABIALIS 09/05/2009  . UNSPECIFIED TACHYCARDIA 09/05/2009    Social History   Socioeconomic History  . Marital status: Married    Spouse name: Not on file  . Number of children: Not on file  . Years of education: Not on file  . Highest education level: Not on file  Occupational History  . Not on file  Social Needs  . Financial resource strain: Not on file  . Food insecurity:    Worry: Not on file    Inability: Not on file  . Transportation needs:    Medical: Not on file    Non-medical: Not on file  Tobacco Use  . Smoking status: Never Smoker  . Smokeless tobacco: Never Used  Substance and Sexual Activity  . Alcohol use: Yes    Alcohol/week: 0.0 oz    Comment: occ.   . Drug use: No  . Sexual activity: Not on file  Lifestyle  . Physical activity:    Days per week: Not on file    Minutes per session: Not on file  . Stress: Not on file  Relationships  . Social connections:    Talks on phone: Not on file    Gets together: Not on file    Attends religious service: Not on file    Active member of club or organization: Not on file    Attends meetings of clubs or organizations: Not on file    Relationship status: Not on file  . Intimate partner violence:    Fear of current or ex partner: Not on file    Emotionally abused: Not on file    Physically abused: Not on file    Forced sexual activity: Not on file  Other Topics Concern  . Not on file  Social History Narrative   Works in [employer].    Married with  no kids. Married for 11 years.        Past Surgical History:  Procedure Laterality Date  . COLONOSCOPY    . HERNIA REPAIR    . SPINE SURGERY      Family History  Problem Relation Age of Onset  . Renal cancer Father   . Colon polyps Father   . Kidney cancer Father   . Arthritis Mother   . Hyperlipidemia Mother   . Colon cancer Paternal Aunt   . Esophageal cancer Neg Hx   . Stomach cancer Neg Hx   . Rectal cancer Neg Hx     No Known Allergies  Current Outpatient Medications on File Prior to Visit  Medication Sig Dispense Refill  . Ascorbic Acid (VITAMIN C PO) Take by mouth.    . Cholecalciferol (VITAMIN D-3) 5000 units TABS Take 1 tablet by mouth daily.    Marland Kitchen doxycycline (VIBRAMYCIN) 100 MG capsule Take 1 capsule (100 mg total) by mouth 2 (two) times daily. 14 capsule 0  . loratadine (CLARITIN) 10 MG tablet Take 10 mg by mouth  daily.    . Multiple Vitamin (MULTIVITAMIN) tablet Take 1 tablet by mouth daily.    . Omega-3 Fatty Acids (FISH OIL PO) Take by mouth.    Marland Kitchen VITAMIN E PO Take by mouth.     No current facility-administered medications on file prior to visit.     BP 102/66   Temp 97.9 F (36.6 C) (Oral)   Wt 165 lb (74.8 kg)   BMI 24.90 kg/m       Objective:   Physical Exam  Constitutional: He is oriented to person, place, and time. He appears well-developed and well-nourished. No distress.  Neurological: He is oriented to person, place, and time.  Skin: Skin is warm and dry. He is not diaphoretic.  Dime sized non fluctuant area on left temple area. No redness or warmth noted.   Nursing note and vitals reviewed.     Assessment & Plan:  1. Abscess - Appears as the beginning of small abscess. He is already on 7 day course of Doxycycline. Will have him apply warm compress and follow up if not resolved at the end of the antibiotic regimen   Dorothyann Peng, NP

## 2018-04-22 ENCOUNTER — Encounter: Payer: BLUE CROSS/BLUE SHIELD | Admitting: Adult Health

## 2018-07-30 ENCOUNTER — Encounter: Payer: Self-pay | Admitting: Adult Health

## 2018-07-30 ENCOUNTER — Ambulatory Visit (INDEPENDENT_AMBULATORY_CARE_PROVIDER_SITE_OTHER): Payer: BC Managed Care – PPO

## 2018-07-30 ENCOUNTER — Ambulatory Visit (INDEPENDENT_AMBULATORY_CARE_PROVIDER_SITE_OTHER): Payer: BC Managed Care – PPO | Admitting: Adult Health

## 2018-07-30 ENCOUNTER — Other Ambulatory Visit: Payer: Self-pay

## 2018-07-30 VITALS — BP 118/64 | HR 86 | Temp 98.3°F | Wt 163.6 lb

## 2018-07-30 DIAGNOSIS — Z125 Encounter for screening for malignant neoplasm of prostate: Secondary | ICD-10-CM | POA: Diagnosis not present

## 2018-07-30 DIAGNOSIS — Z0000 Encounter for general adult medical examination without abnormal findings: Secondary | ICD-10-CM | POA: Diagnosis not present

## 2018-07-30 DIAGNOSIS — J302 Other seasonal allergic rhinitis: Secondary | ICD-10-CM

## 2018-07-30 DIAGNOSIS — R0602 Shortness of breath: Secondary | ICD-10-CM

## 2018-07-30 LAB — CBC WITH DIFFERENTIAL/PLATELET
Basophils Absolute: 0 10*3/uL (ref 0.0–0.1)
Basophils Relative: 1 % (ref 0.0–3.0)
Eosinophils Absolute: 0.1 10*3/uL (ref 0.0–0.7)
Eosinophils Relative: 3.3 % (ref 0.0–5.0)
HCT: 43.9 % (ref 39.0–52.0)
Hemoglobin: 14.8 g/dL (ref 13.0–17.0)
Lymphocytes Relative: 24 % (ref 12.0–46.0)
Lymphs Abs: 1 10*3/uL (ref 0.7–4.0)
MCHC: 33.7 g/dL (ref 30.0–36.0)
MCV: 94.7 fl (ref 78.0–100.0)
Monocytes Absolute: 0.4 10*3/uL (ref 0.1–1.0)
Monocytes Relative: 8.8 % (ref 3.0–12.0)
Neutro Abs: 2.6 10*3/uL (ref 1.4–7.7)
Neutrophils Relative %: 62.9 % (ref 43.0–77.0)
Platelets: 233 10*3/uL (ref 150.0–400.0)
RBC: 4.63 Mil/uL (ref 4.22–5.81)
RDW: 12.7 % (ref 11.5–15.5)
WBC: 4.1 10*3/uL (ref 4.0–10.5)

## 2018-07-30 LAB — COMPREHENSIVE METABOLIC PANEL
ALT: 22 U/L (ref 0–53)
AST: 15 U/L (ref 0–37)
Albumin: 4.3 g/dL (ref 3.5–5.2)
Alkaline Phosphatase: 54 U/L (ref 39–117)
BUN: 17 mg/dL (ref 6–23)
CO2: 31 mEq/L (ref 19–32)
Calcium: 9 mg/dL (ref 8.4–10.5)
Chloride: 103 mEq/L (ref 96–112)
Creatinine, Ser: 0.95 mg/dL (ref 0.40–1.50)
GFR: 81.17 mL/min (ref >60.00)
Glucose, Bld: 95 mg/dL (ref 70–99)
Potassium: 4.3 mEq/L (ref 3.5–5.1)
Sodium: 139 mEq/L (ref 135–145)
Total Bilirubin: 0.6 mg/dL (ref 0.2–1.2)
Total Protein: 6.2 g/dL (ref 6.0–8.3)

## 2018-07-30 LAB — LIPID PANEL
Cholesterol: 176 mg/dL (ref 0–200)
HDL: 54.4 mg/dL (ref >39.00)
LDL Cholesterol: 104 mg/dL — ABNORMAL HIGH (ref 0–99)
NonHDL: 121.92
Total CHOL/HDL Ratio: 3
Triglycerides: 91 mg/dL (ref 0.0–149.0)
VLDL: 18.2 mg/dL (ref 0.0–40.0)

## 2018-07-30 LAB — PSA: PSA: 0.35 ng/mL (ref 0.10–4.00)

## 2018-07-30 LAB — TSH: TSH: 3.28 u[IU]/mL (ref 0.35–4.50)

## 2018-07-30 NOTE — Patient Instructions (Signed)
It was great seeing you today   Your EKG looked great!   We will follow up with you regarding your lab work and chest xray

## 2018-07-30 NOTE — Progress Notes (Signed)
Subjective:    Patient ID: Antonio Valdez, male    DOB: Dec 06, 1960, 58 y.o.   MRN: [id number]  HPI  Patient presents for yearly preventative medicine examination. He is a pleasant and healthy 58 year old male who  has a past medical history of ALLERGIC RHINITIS CAUSE UNSPECIFIED (09/05/2009), HERPES LABIALIS (09/05/2009), and UNSPECIFIED TACHYCARDIA (09/05/2009).   Seasonal Allergies - takes Claritin and Nasocort as needed.   Fatigue - has been an ongoing intermittent issue. He was recently seen by [facility] and reports that he was told that " his labs came back normal and that he is just getting a little older".   SOB- has noticed that since working from home that he has occasional issued with SOB. This happens when he is under work stress and he feels anxious during these instances. The anxiety and SOB only last a few seconds. Denies chest pain or SOB with exertion.   All immunizations and health maintenance protocols were reviewed with the patient and needed orders were placed. He is UTD on vaccinations   Appropriate screening laboratory values were ordered for the patient including screening of hyperlipidemia, renal function and hepatic function.  Medication reconciliation,  past medical history, social history, problem list and allergies were reviewed in detail with the patient  Goals were established with regard to weight loss, exercise, and  diet in compliance with medications. He is walking and trying to eat healthy.   Wt Readings from Last 3 Encounters:  07/30/18 163 lb 9.6 oz (74.2 kg)  06/04/17 165 lb (74.8 kg)  06/03/17 164 lb (74.4 kg)   He is up to date on routine screening colonoscopies, dental and vision screens.     Review of Systems  Constitutional: Negative.   HENT: Negative.   Eyes: Negative.   Respiratory: Negative.   Cardiovascular: Negative.   Gastrointestinal: Negative.   Endocrine: Negative.   Genitourinary: Negative.    Musculoskeletal: Negative.   Skin: Negative.   Allergic/Immunologic: Negative.   Neurological: Negative.   Hematological: Negative.   Psychiatric/Behavioral: Negative.   All other systems reviewed and are negative.  Past Medical History:  Diagnosis Date  . ALLERGIC RHINITIS CAUSE UNSPECIFIED 09/05/2009  . HERPES LABIALIS 09/05/2009  . UNSPECIFIED TACHYCARDIA 09/05/2009    Social History   Socioeconomic History  . Marital status: Married    Spouse name: Not on file  . Number of children: Not on file  . Years of education: Not on file  . Highest education level: Not on file  Occupational History  . Not on file  Social Needs  . Financial resource strain: Not on file  . Food insecurity    Worry: Not on file    Inability: Not on file  . Transportation needs    Medical: Not on file    Non-medical: Not on file  Tobacco Use  . Smoking status: Never Smoker  . Smokeless tobacco: Never Used  Substance and Sexual Activity  . Alcohol use: Yes    Alcohol/week: 0.0 standard drinks    Comment: occ.   . Drug use: No  . Sexual activity: Not on file  Lifestyle  . Physical activity    Days per week: Not on file    Minutes per session: Not on file  . Stress: Not on file  Relationships  . Social Herbalist on phone: Not on file    Gets together: Not on file    Attends religious service: Not on  file    Active member of club or organization: Not on file    Attends meetings of clubs or organizations: Not on file    Relationship status: Not on file  . Intimate partner violence    Fear of current or ex partner: Not on file    Emotionally abused: Not on file    Physically abused: Not on file    Forced sexual activity: Not on file  Other Topics Concern  . Not on file  Social History Narrative   Works in [employer].    Married with no kids. Married for 11 years.        Past Surgical History:  Procedure Laterality Date  . COLONOSCOPY    . HERNIA REPAIR    . SPINE SURGERY       Family History  Problem Relation Age of Onset  . Renal cancer Father   . Antonio polyps Father   . Kidney cancer Father   . Arthritis Mother   . Hyperlipidemia Mother   . Antonio cancer Paternal Aunt   . Esophageal cancer Neg Hx   . Stomach cancer Neg Hx   . Rectal cancer Neg Hx     No Known Allergies  Current Outpatient Medications on File Prior to Visit  Medication Sig Dispense Refill  . Ascorbic Acid (VITAMIN C PO) Take by mouth.    . Cholecalciferol (VITAMIN D-3) 5000 units TABS Take 1 tablet by mouth daily.    Marland Kitchen doxycycline (VIBRAMYCIN) 100 MG capsule Take 1 capsule (100 mg total) by mouth 2 (two) times daily. 14 capsule 0  . loratadine (CLARITIN) 10 MG tablet Take 10 mg by mouth daily.    . Multiple Vitamin (MULTIVITAMIN) tablet Take 1 tablet by mouth daily.    . Omega-3 Fatty Acids (FISH OIL PO) Take by mouth.    Marland Kitchen VITAMIN E PO Take by mouth.     No current facility-administered medications on file prior to visit.     BP 118/64 (BP Location: Left Arm, Patient Position: Sitting, Cuff Size: Normal)   Pulse 86   Temp 98.3 F (36.8 C) (Oral)   Wt 163 lb 9.6 oz (74.2 kg)   SpO2 95%   BMI 24.69 kg/m       Objective:   Physical Exam Vitals signs and nursing note reviewed.  Constitutional:      General: He is not in acute distress.    Appearance: Normal appearance. He is well-developed. He is obese. He is not diaphoretic.  HENT:     Head: Normocephalic and atraumatic.     Right Ear: Tympanic membrane, ear canal and external ear normal. There is no impacted cerumen.     Left Ear: Tympanic membrane, ear canal and external ear normal. There is no impacted cerumen.     Nose: Nose normal. No congestion or rhinorrhea.     Mouth/Throat:     Mouth: Mucous membranes are moist.     Pharynx: Oropharynx is clear. No oropharyngeal exudate or posterior oropharyngeal erythema.  Eyes:     General:        Right eye: No discharge.        Left eye: No discharge.      Conjunctiva/sclera: Conjunctivae normal.     Pupils: Pupils are equal, round, and reactive to light.  Neck:     Thyroid: No thyromegaly.     Trachea: No tracheal deviation.  Cardiovascular:     Rate and Rhythm: Normal rate and regular rhythm.  Pulses: Normal pulses.     Heart sounds: Normal heart sounds. No murmur. No friction rub. No gallop.   Pulmonary:     Effort: Pulmonary effort is normal. No respiratory distress.     Breath sounds: Normal breath sounds. No stridor. No wheezing, rhonchi or rales.  Chest:     Chest wall: No tenderness.  Abdominal:     General: Abdomen is flat. Bowel sounds are normal. There is no distension.     Palpations: Abdomen is soft. There is no mass.     Tenderness: There is no abdominal tenderness. There is no right CVA tenderness, left CVA tenderness, guarding or rebound.     Hernia: No hernia is present.  Musculoskeletal: Normal range of motion.  Lymphadenopathy:     Cervical: No cervical adenopathy.  Skin:    General: Skin is warm and dry.     Capillary Refill: Capillary refill takes less than 2 seconds.     Coloration: Skin is not jaundiced or pale.     Findings: No bruising, erythema, lesion or rash.  Neurological:     General: No focal deficit present.     Mental Status: He is alert and oriented to person, place, and time. Mental status is at baseline.     Cranial Nerves: No cranial nerve deficit.     Coordination: Coordination normal.  Psychiatric:        Mood and Affect: Mood normal.        Behavior: Behavior normal.        Thought Content: Thought content normal.        Judgment: Judgment normal.       Assessment & Plan:  1. Routine general medical examination at a health care facility - Continue to diet and exercise  - Follow up in one year or sooner if needed - CBC with Differential/Platelet - Comprehensive metabolic panel - Lipid panel - TSH  2. Seasonal allergies  - CBC with Differential/Platelet - Comprehensive  metabolic panel - Lipid panel - TSH  3. Prostate cancer screening  - PSA   4. Shortness of breath - Likely anxiety from work stress. Not concerned for cardiac issues  - EKG 12-Lead- NSR, Rate 62 - DG Chest 2 View; Future   Dorothyann Peng, NP

## 2019-08-03 ENCOUNTER — Other Ambulatory Visit: Payer: Self-pay

## 2019-08-03 ENCOUNTER — Ambulatory Visit (INDEPENDENT_AMBULATORY_CARE_PROVIDER_SITE_OTHER): Payer: BC Managed Care – PPO | Admitting: Adult Health

## 2019-08-03 ENCOUNTER — Encounter: Payer: Self-pay | Admitting: Adult Health

## 2019-08-03 VITALS — BP 110/80 | Temp 97.7°F | Ht 69.0 in | Wt 166.0 lb

## 2019-08-03 DIAGNOSIS — Z0184 Encounter for antibody response examination: Secondary | ICD-10-CM

## 2019-08-03 DIAGNOSIS — Z0000 Encounter for general adult medical examination without abnormal findings: Secondary | ICD-10-CM | POA: Diagnosis not present

## 2019-08-03 DIAGNOSIS — J302 Other seasonal allergic rhinitis: Secondary | ICD-10-CM | POA: Diagnosis not present

## 2019-08-03 DIAGNOSIS — E559 Vitamin D deficiency, unspecified: Secondary | ICD-10-CM

## 2019-08-03 DIAGNOSIS — Z125 Encounter for screening for malignant neoplasm of prostate: Secondary | ICD-10-CM

## 2019-08-03 NOTE — Progress Notes (Signed)
Subjective:    Patient ID: Antonio Valdez, male    DOB: [birth year], 59 y.o.   MRN: [id number]  HPI Patient presents for yearly preventative medicine examination. He is a pleasant 59 year old male who  has a past medical history of ALLERGIC RHINITIS CAUSE UNSPECIFIED (09/05/2009), HERPES LABIALIS (09/05/2009), and UNSPECIFIED TACHYCARDIA (09/05/2009).  Seasonal Allergies - takes OTC Claritin   Vitamin D deficiency - takes Vitamin D 5000 units daily - this was prescribed by [facility]   Covid Antibody screening - he reports that his wife was diagnosed with COVID 19 - about two months ago. He would like to check his antibody status. He did not have symptoms and has not received the covid vaccination   All immunizations and health maintenance protocols were reviewed with the patient and needed orders were placed.  Appropriate screening laboratory values were ordered for the patient including screening of hyperlipidemia, renal function and hepatic function. If indicated by BPH, a PSA was ordered.  Medication reconciliation,  past medical history, social history, problem list and allergies were reviewed in detail with the patient  Goals were established with regard to weight loss, exercise, and  diet in compliance with medications. He is walking and eating healthy Wt Readings from Last 3 Encounters:  07/30/18 163 lb 9.6 oz (74.2 kg)  06/04/17 165 lb (74.8 kg)  06/03/17 164 lb (74.4 kg)   He is up-to-date on routine colon cancer screening  Review of Systems  Constitutional: Negative.   HENT: Negative.   Eyes: Negative.   Respiratory: Negative.   Cardiovascular: Negative.   Gastrointestinal: Negative.   Endocrine: Negative.   Genitourinary: Negative.   Musculoskeletal: Negative.   Skin: Negative.   Allergic/Immunologic: Negative.   Neurological: Negative.   Hematological: Negative.   Psychiatric/Behavioral: Negative.   All other systems reviewed and are  negative.  Past Medical History:  Diagnosis Date   ALLERGIC RHINITIS CAUSE UNSPECIFIED 09/05/2009   HERPES LABIALIS 09/05/2009   UNSPECIFIED TACHYCARDIA 09/05/2009    Social History   Socioeconomic History   Marital status: Married    Spouse name: Not on file   Number of children: Not on file   Years of education: Not on file   Highest education level: Not on file  Occupational History   Not on file  Tobacco Use   Smoking status: Never Smoker   Smokeless tobacco: Never Used  Substance and Sexual Activity   Alcohol use: Yes    Alcohol/week: 0.0 standard drinks    Comment: occ.    Drug use: No   Sexual activity: Not on file  Other Topics Concern   Not on file  Social History Narrative   Works in [employer].    Married with no kids. Married for 11 years.       Social Determinants of Health   Financial Resource Strain:    Difficulty of Paying Living Expenses:   Food Insecurity:    Worried About Charity fundraiser in the Last Year:    Arboriculturist in the Last Year:   Transportation Needs:    Film/video editor (Medical):    Lack of Transportation (Non-Medical):   Physical Activity:    Days of Exercise per Week:    Minutes of Exercise per Session:   Stress:    Feeling of Stress :   Social Connections:    Frequency of Communication with Friends and Family:    Frequency of Social Gatherings with Friends and  Family:    Attends Religious Services:    Active Member of Clubs or Organizations:    Attends Music therapist:    Marital Status:   Intimate Partner Violence:    Fear of Current or Ex-Partner:    Emotionally Abused:    Physically Abused:    Sexually Abused:     Past Surgical History:  Procedure Laterality Date   COLONOSCOPY     HERNIA REPAIR     SPINE SURGERY      Family History  Problem Relation Age of Onset   Renal cancer Father    Colon polyps Father    Kidney cancer Father    Arthritis  Mother    Hyperlipidemia Mother    Colon cancer Paternal Aunt    Esophageal cancer Neg Hx    Stomach cancer Neg Hx    Rectal cancer Neg Hx     No Known Allergies  Current Outpatient Medications on File Prior to Visit  Medication Sig Dispense Refill   Ascorbic Acid (VITAMIN C PO) Take by mouth.     Cholecalciferol (VITAMIN D-3) 5000 units TABS Take 1 tablet by mouth daily.     loratadine (CLARITIN) 10 MG tablet Take 10 mg by mouth daily.     Multiple Vitamin (MULTIVITAMIN) tablet Take 1 tablet by mouth daily.     Omega-3 Fatty Acids (FISH OIL PO) Take by mouth.     VITAMIN E PO Take by mouth.     No current facility-administered medications on file prior to visit.    There were no vitals taken for this visit.      Objective:   Physical Exam Vitals and nursing note reviewed.  Constitutional:      General: He is not in acute distress.    Appearance: Normal appearance. He is well-developed and normal weight.  HENT:     Head: Normocephalic and atraumatic.     Right Ear: Tympanic membrane, ear canal and external ear normal. There is no impacted cerumen.     Left Ear: Tympanic membrane, ear canal and external ear normal. There is no impacted cerumen.     Nose: Nose normal. No congestion or rhinorrhea.     Mouth/Throat:     Mouth: Mucous membranes are moist.     Pharynx: Oropharynx is clear. No oropharyngeal exudate or posterior oropharyngeal erythema.  Eyes:     General:        Right eye: No discharge.        Left eye: No discharge.     Extraocular Movements: Extraocular movements intact.     Conjunctiva/sclera: Conjunctivae normal.     Pupils: Pupils are equal, round, and reactive to light.  Neck:     Vascular: No carotid bruit.     Trachea: No tracheal deviation.  Cardiovascular:     Rate and Rhythm: Normal rate and regular rhythm.     Pulses: Normal pulses.     Heart sounds: Normal heart sounds. No murmur heard.  No friction rub. No gallop.   Pulmonary:      Effort: Pulmonary effort is normal. No respiratory distress.     Breath sounds: Normal breath sounds. No stridor. No wheezing, rhonchi or rales.  Chest:     Chest wall: No tenderness.  Abdominal:     General: Bowel sounds are normal. There is no distension.     Palpations: Abdomen is soft. There is no mass.     Tenderness: There is no abdominal tenderness. There is  no right CVA tenderness, left CVA tenderness, guarding or rebound.     Hernia: No hernia is present.  Musculoskeletal:        General: No swelling, tenderness, deformity or signs of injury. Normal range of motion.     Right lower leg: No edema.     Left lower leg: No edema.  Lymphadenopathy:     Cervical: No cervical adenopathy.  Skin:    General: Skin is warm and dry.     Capillary Refill: Capillary refill takes less than 2 seconds.     Coloration: Skin is not jaundiced or pale.     Findings: No bruising, erythema, lesion or rash.  Neurological:     General: No focal deficit present.     Mental Status: He is alert and oriented to person, place, and time.     Cranial Nerves: No cranial nerve deficit.     Sensory: No sensory deficit.     Motor: No weakness.     Coordination: Coordination normal.     Gait: Gait normal.     Deep Tendon Reflexes: Reflexes normal.  Psychiatric:        Mood and Affect: Mood normal.        Behavior: Behavior normal.        Thought Content: Thought content normal.        Judgment: Judgment normal.       Assessment & Plan:  1. Routine general medical examination at a health care facility - Continue with diet and exercise  - Follow up in one year or sooner if needed - CBC with Differential/Platelet - Comprehensive metabolic panel - Hemoglobin A1c - Lipid panel - TSH  2. Seasonal allergies - Continue with Claritin   3. Prostate cancer screening  - PSA  4. Vitamin D deficiency  - Vitamin D, 25-hydroxy  5. COVID-19 virus IgM antibody test result unknown  - SARS-CoV-2  Antibody, IgM  Dorothyann Peng, NP

## 2019-08-03 NOTE — Patient Instructions (Signed)
It was great seeing you today   We will follow up with you regarding your labs   Keep exercising and eating healthy

## 2019-08-03 NOTE — Addendum Note (Signed)
Addended by: Marrion Coy on: 08/03/2019 09:39 AM   Modules accepted: Orders

## 2019-08-03 NOTE — Addendum Note (Signed)
Addended by: Marrion Coy on: 08/03/2019 09:43 AM   Modules accepted: Orders

## 2019-08-04 ENCOUNTER — Other Ambulatory Visit: Payer: Self-pay | Admitting: Family Medicine

## 2019-08-04 LAB — LIPID PANEL
Cholesterol: 205 mg/dL — ABNORMAL HIGH (ref <200)
HDL: 50 mg/dL (ref >=40)
LDL Cholesterol (Calc): 136 mg/dL (calc) — ABNORMAL HIGH
Non-HDL Cholesterol (Calc): 155 mg/dL (calc) — ABNORMAL HIGH (ref <130)
Total CHOL/HDL Ratio: 4.1 (calc) (ref <5.0)
Triglycerides: 87 mg/dL (ref <150)

## 2019-08-04 LAB — CBC WITH DIFFERENTIAL/PLATELET
Absolute Monocytes: 344 cells/uL (ref 200–950)
Basophils Absolute: 50 cells/uL (ref 0–200)
Basophils Relative: 1.2 %
Eosinophils Absolute: 109 cells/uL (ref 15–500)
Eosinophils Relative: 2.6 %
HCT: 46 % (ref 38.5–50.0)
Hemoglobin: 15.2 g/dL (ref 13.2–17.1)
Lymphs Abs: 1079 cells/uL (ref 850–3900)
MCH: 31.6 pg (ref 27.0–33.0)
MCHC: 33 g/dL (ref 32.0–36.0)
MCV: 95.6 fL (ref 80.0–100.0)
MPV: 10.4 fL (ref 7.5–12.5)
Monocytes Relative: 8.2 %
Neutro Abs: 2617 cells/uL (ref 1500–7800)
Neutrophils Relative %: 62.3 %
Platelets: 214 10*3/uL (ref 140–400)
RBC: 4.81 10*6/uL (ref 4.20–5.80)
RDW: 11.8 % (ref 11.0–15.0)
Total Lymphocyte: 25.7 %
WBC: 4.2 10*3/uL (ref 3.8–10.8)

## 2019-08-04 LAB — COMPREHENSIVE METABOLIC PANEL
AG Ratio: 2 (calc) (ref 1.0–2.5)
ALT: 26 U/L (ref 9–46)
AST: 17 U/L (ref 10–35)
Albumin: 4.3 g/dL (ref 3.6–5.1)
Alkaline phosphatase (APISO): 56 U/L (ref 35–144)
BUN: 17 mg/dL (ref 7–25)
CO2: 25 mmol/L (ref 20–32)
Calcium: 9.2 mg/dL (ref 8.6–10.3)
Chloride: 103 mmol/L (ref 98–110)
Creat: 1.02 mg/dL (ref 0.70–1.33)
Globulin: 2.2 g/dL (calc) (ref 1.9–3.7)
Glucose, Bld: 93 mg/dL (ref 65–99)
Potassium: 4.4 mmol/L (ref 3.5–5.3)
Sodium: 138 mmol/L (ref 135–146)
Total Bilirubin: 0.6 mg/dL (ref 0.2–1.2)
Total Protein: 6.5 g/dL (ref 6.1–8.1)

## 2019-08-04 LAB — HEMOGLOBIN A1C
Hgb A1c MFr Bld: 5.2 % of total Hgb (ref <5.7)
Mean Plasma Glucose: 103 (calc)
eAG (mmol/L): 5.7 (calc)

## 2019-08-04 LAB — SARS COV-2 SEROLOGY(COVID-19)AB(IGG,IGM),IMMUNOASSAY
SARS CoV-2 AB IgG: NEGATIVE
SARS CoV-2 IgM: NEGATIVE

## 2019-08-04 LAB — PSA: PSA: 0.4 ng/mL (ref <=4.0)

## 2019-08-04 LAB — VITAMIN D 25 HYDROXY (VIT D DEFICIENCY, FRACTURES): Vit D, 25-Hydroxy: 58 ng/mL (ref 30–100)

## 2019-08-04 LAB — TSH: TSH: 4.04 mIU/L (ref 0.40–4.50)

## 2019-08-04 MED ORDER — ROSUVASTATIN CALCIUM 5 MG PO TABS
5.0000 mg | ORAL_TABLET | Freq: Every day | ORAL | 3 refills | Status: DC
Start: 2019-08-04 — End: 2020-08-30

## 2019-09-17 ENCOUNTER — Other Ambulatory Visit: Payer: BC Managed Care – PPO

## 2019-09-17 ENCOUNTER — Other Ambulatory Visit: Payer: Self-pay | Admitting: Critical Care Medicine

## 2019-09-17 DIAGNOSIS — Z20822 Contact with and (suspected) exposure to covid-19: Secondary | ICD-10-CM

## 2019-09-18 LAB — NOVEL CORONAVIRUS, NAA: SARS-CoV-2, NAA: NOT DETECTED

## 2019-09-18 LAB — SARS-COV-2, NAA 2 DAY TAT

## 2019-11-30 ENCOUNTER — Other Ambulatory Visit: Payer: Self-pay | Admitting: Adult Health

## 2019-11-30 ENCOUNTER — Encounter: Payer: Self-pay | Admitting: Adult Health

## 2019-11-30 DIAGNOSIS — Z8241 Family history of sudden cardiac death: Secondary | ICD-10-CM

## 2020-02-08 ENCOUNTER — Ambulatory Visit: Payer: Managed Care, Other (non HMO) | Admitting: Cardiovascular Disease

## 2020-02-09 ENCOUNTER — Ambulatory Visit (INDEPENDENT_AMBULATORY_CARE_PROVIDER_SITE_OTHER): Payer: Managed Care, Other (non HMO) | Admitting: Cardiovascular Disease

## 2020-02-09 ENCOUNTER — Other Ambulatory Visit: Payer: Self-pay

## 2020-02-09 ENCOUNTER — Encounter: Payer: Self-pay | Admitting: Cardiovascular Disease

## 2020-02-09 DIAGNOSIS — E782 Mixed hyperlipidemia: Secondary | ICD-10-CM | POA: Diagnosis not present

## 2020-02-09 DIAGNOSIS — Z8249 Family history of ischemic heart disease and other diseases of the circulatory system: Secondary | ICD-10-CM | POA: Diagnosis not present

## 2020-02-09 DIAGNOSIS — E785 Hyperlipidemia, unspecified: Secondary | ICD-10-CM | POA: Insufficient documentation

## 2020-02-09 NOTE — Patient Instructions (Signed)
Medication Instructions:  Your physician recommends that you continue on your current medications as directed. Please refer to the Current Medication list given to you today.  Testing/Procedures: CT coronary calcium score. This test is done at [address]. This is $99 out of pocket.   Coronary CalciumScan A coronary calcium scan is an imaging test used to look for deposits of calcium and other fatty materials (plaques) in the inner lining of the blood vessels of the heart (coronary arteries). These deposits of calcium and plaques can partly clog and narrow the coronary arteries without producing any symptoms or warning signs. This puts a person at risk for a heart attack. This test can detect these deposits before symptoms develop. Tell a health care provider about:  Any allergies you have.  All medicines you are taking, including vitamins, herbs, eye drops, creams, and over-the-counter medicines.  Any problems you or family members have had with anesthetic medicines.  Any blood disorders you have.  Any surgeries you have had.  Any medical conditions you have.  Whether you are pregnant or may be pregnant. What are the risks? Generally, this is a safe procedure. However, problems may occur, including:  Harm to a pregnant woman and her unborn baby. This test involves the use of radiation. Radiation exposure can be dangerous to a pregnant woman and her unborn baby. If you are pregnant, you generally should not have this procedure done.  Slight increase in the risk of cancer. This is because of the radiation involved in the test. What happens before the procedure? No preparation is needed for this procedure. What happens during the procedure?  You will undress and remove any jewelry around your neck or chest.  You will put on a hospital gown.  Sticky electrodes will be placed on your chest. The electrodes will be connected to an electrocardiogram (ECG) machine to  record a tracing of the electrical activity of your heart.  A CT scanner will take pictures of your heart. During this time, you will be asked to lie still and hold your breath for 2-3 seconds while a picture of your heart is being taken. The procedure may vary among health care providers and hospitals. What happens after the procedure?  You can get dressed.  You can return to your normal activities.  It is up to you to get the results of your test. Ask your health care provider, or the department that is doing the test, when your results will be ready. Summary  A coronary calcium scan is an imaging test used to look for deposits of calcium and other fatty materials (plaques) in the inner lining of the blood vessels of the heart (coronary arteries).  Generally, this is a safe procedure. Tell your health care provider if you are pregnant or may be pregnant.  No preparation is needed for this procedure.  A CT scanner will take pictures of your heart.  You can return to your normal activities after the scan is done. This information is not intended to replace advice given to you by your health care provider. Make sure you discuss any questions you have with your health care provider. Document Released: 07/06/2007 Document Revised: 11/27/2015 Document Reviewed: 11/27/2015 Elsevier Interactive Patient Education  2017 Elsevier Inc.   Follow-Up: At [facility], you and your health needs are our priority.  As part of our continuing mission to provide you with exceptional heart care, we have created designated Provider Care Teams.  These   Care Teams include your primary Cardiologist (physician) and Advanced Practice Providers (APPs -  Physician Assistants and Nurse Practitioners) who all work together to provide you with the care you need, when you need it.  We recommend signing up for the patient portal called "MyChart".  Sign up information is provided on this After Visit Summary.  MyChart  is used to connect with patients for Virtual Visits (Telemedicine).  Patients are able to view lab/test results, encounter notes, upcoming appointments, etc.  Non-urgent messages can be sent to your provider as well.   To learn more about what you can do with MyChart, go to [email or web address].    Your next appointment:   12 month(s)  The format for your next appointment:   In Person  Provider:   Quay Burow, MD

## 2020-02-09 NOTE — Assessment & Plan Note (Signed)
History of mild hyperlipidemia with lipid profile performed 08/03/2019 revealing a total cholesterol of 205, LDL of 136 and HDL of 50.  He is not on statin therapy currently.  I am going to get a coronary calcium score to stratify and determine need for more aggressive treatment.

## 2020-02-09 NOTE — Progress Notes (Signed)
02/09/2020 Antonio Valdez Valdez   Aug 29, 1960  [id number]  Primary Physician Antonio Valdez Valdez, Antonio Rumps, NP Primary Cardiologist: Lorretta Harp MD Antonio Valdez Valdez, [place]  HPI:  Antonio Valdez Valdez is a 60 y.o. thin and fit appearing married Caucasian male with no children referred by Dr. Carlisle Valdez, his PCP, to be established in the practice because of family history of heart disease.  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  His only risk factor is mild hyperlipidemia currently untreated and family history with a father who had a myocardial infarction in his 59s.  He is never had a heart attack or stroke.  He denies chest pain or shortness of breath.   Current Meds  Medication Sig  . Ascorbic Acid (VITAMIN C PO) Take by mouth.  . Cholecalciferol (VITAMIN D-3) 5000 units TABS Take 1 tablet by mouth daily.  Marland Kitchen loratadine (CLARITIN) 10 MG tablet Take 10 mg by mouth daily.  . Multiple Vitamin (MULTIVITAMIN) tablet Take 1 tablet by mouth daily.  Marland Kitchen VITAMIN E PO Take by mouth.  . [DISCONTINUED] Omega-3 Fatty Acids (FISH OIL PO) Take by mouth.     No Known Allergies  Social History   Socioeconomic History  . Marital status: Married    Spouse name: Not on file  . Number of children: Not on file  . Years of education: Not on file  . Highest education level: Not on file  Occupational History  . Not on file  Tobacco Use  . Smoking status: Never Smoker  . Smokeless tobacco: Never Used  Substance and Sexual Activity  . Alcohol use: Yes    Alcohol/week: 0.0 standard drinks    Comment: occ.   . Drug use: No  . Sexual activity: Not on file  Other Topics Concern  . Not on file  Social History Narrative   Works in [employer].    Married with no kids. Married for 11 years.       Social Determinants of Health   Financial Resource Strain: Not on file  Food Insecurity: Not on file  Transportation Needs: Not on file  Physical Activity: Not on file  Stress: Not on file  Social Connections: Not on file  Intimate  Partner Violence: Not on file     Review of Systems: General: negative for chills, fever, night sweats or weight changes.  Cardiovascular: negative for chest pain, dyspnea on exertion, edema, orthopnea, palpitations, paroxysmal nocturnal dyspnea or shortness of breath Dermatological: negative for rash Respiratory: negative for cough or wheezing Urologic: negative for hematuria Abdominal: negative for nausea, vomiting, diarrhea, bright red blood per rectum, melena, or hematemesis Neurologic: negative for visual changes, syncope, or dizziness All other systems reviewed and are otherwise negative except as noted above.    Blood pressure 112/80, pulse 71, height 5\' 9"  (1.753 m), weight 170 lb (77.1 kg).  General appearance: alert and no distress Neck: no adenopathy, no carotid bruit, no JVD, supple, symmetrical, trachea midline and thyroid not enlarged, symmetric, no tenderness/mass/nodules Lungs: clear to auscultation bilaterally Heart: regular rate and rhythm, S1, S2 normal, no murmur, click, rub or gallop Extremities: extremities normal, atraumatic, no cyanosis or edema Pulses: 2+ and symmetric Skin: Skin color, texture, turgor normal. No rashes or lesions Neurologic: Alert and oriented X 3, normal strength and tone. Normal symmetric reflexes. Normal coordination and gait  EKG sinus rhythm at 71 without ST or T wave changes.  I personally reviewed this EKG.  ASSESSMENT AND PLAN:   Hyperlipidemia History of mild  hyperlipidemia with lipid profile performed 08/03/2019 revealing a total cholesterol of 205, LDL of 136 and HDL of 50.  He is not on statin therapy currently.  I am going to get a coronary calcium score to stratify and determine need for more aggressive treatment.  Family history of heart disease Father had a myocardial infarction in his 71s.      Lorretta Harp MD FACP,FACC,FAHA, [facility] 02/09/2020 10:23 AM

## 2020-02-09 NOTE — Assessment & Plan Note (Signed)
Father had a myocardial infarction in his 60s.

## 2020-02-23 ENCOUNTER — Other Ambulatory Visit: Payer: Managed Care, Other (non HMO)

## 2020-02-24 ENCOUNTER — Other Ambulatory Visit: Payer: Self-pay

## 2020-02-24 ENCOUNTER — Ambulatory Visit (INDEPENDENT_AMBULATORY_CARE_PROVIDER_SITE_OTHER)
Admission: RE | Admit: 2020-02-24 | Discharge: 2020-02-24 | Disposition: A | Discharge status: Home / Self Care | Payer: Self-pay | Source: Ambulatory Visit | Attending: Cardiovascular Disease | Admitting: Cardiovascular Disease

## 2020-02-24 DIAGNOSIS — Z8249 Family history of ischemic heart disease and other diseases of the circulatory system: Secondary | ICD-10-CM

## 2020-02-24 DIAGNOSIS — E782 Mixed hyperlipidemia: Secondary | ICD-10-CM

## 2020-04-15 DIAGNOSIS — E782 Mixed hyperlipidemia: Secondary | ICD-10-CM

## 2020-04-28 LAB — NMR, LIPOPROFILE
Cholesterol, Total: 130 mg/dL (ref 100–199)
HDL Particle Number: 23.4 umol/L — ABNORMAL LOW (ref >=30.5)
HDL-C: 34 mg/dL — ABNORMAL LOW (ref >39)
LDL Particle Number: 1073 nmol/L — ABNORMAL HIGH (ref <1000)
LDL Size: 20.8 nm (ref >20.5)
LDL-C (NIH Calc): 86 mg/dL (ref 0–99)
LP-IR Score: 46 — ABNORMAL HIGH (ref <=45)
Small LDL Particle Number: 443 nmol/L (ref <=527)
Triglycerides: 42 mg/dL (ref 0–149)

## 2020-06-15 ENCOUNTER — Telehealth: Payer: Managed Care, Other (non HMO) | Admitting: Family Medicine

## 2020-08-29 ENCOUNTER — Other Ambulatory Visit: Payer: Self-pay

## 2020-08-30 ENCOUNTER — Encounter: Payer: Self-pay | Admitting: Adult Health

## 2020-08-30 ENCOUNTER — Ambulatory Visit (INDEPENDENT_AMBULATORY_CARE_PROVIDER_SITE_OTHER): Payer: Managed Care, Other (non HMO) | Admitting: Adult Health

## 2020-08-30 ENCOUNTER — Other Ambulatory Visit: Payer: Self-pay | Admitting: Adult Health

## 2020-08-30 VITALS — BP 110/80 | HR 74 | Temp 97.9°F | Ht 68.75 in | Wt 177.0 lb

## 2020-08-30 DIAGNOSIS — E782 Mixed hyperlipidemia: Secondary | ICD-10-CM

## 2020-08-30 DIAGNOSIS — Z0000 Encounter for general adult medical examination without abnormal findings: Secondary | ICD-10-CM | POA: Diagnosis not present

## 2020-08-30 DIAGNOSIS — R7989 Other specified abnormal findings of blood chemistry: Secondary | ICD-10-CM

## 2020-08-30 DIAGNOSIS — E039 Hypothyroidism, unspecified: Secondary | ICD-10-CM | POA: Diagnosis not present

## 2020-08-30 DIAGNOSIS — E559 Vitamin D deficiency, unspecified: Secondary | ICD-10-CM

## 2020-08-30 DIAGNOSIS — Z125 Encounter for screening for malignant neoplasm of prostate: Secondary | ICD-10-CM | POA: Diagnosis not present

## 2020-08-30 DIAGNOSIS — J302 Other seasonal allergic rhinitis: Secondary | ICD-10-CM

## 2020-08-30 LAB — COMPREHENSIVE METABOLIC PANEL
ALT: 27 U/L (ref 0–53)
AST: 19 U/L (ref 0–37)
Albumin: 4.1 g/dL (ref 3.5–5.2)
Alkaline Phosphatase: 56 U/L (ref 39–117)
BUN: 14 mg/dL (ref 6–23)
CO2: 28 mEq/L (ref 19–32)
Calcium: 9.1 mg/dL (ref 8.4–10.5)
Chloride: 101 mEq/L (ref 96–112)
Creatinine, Ser: 1.07 mg/dL (ref 0.40–1.50)
GFR: 75.17 mL/min (ref >60.00)
Glucose, Bld: 83 mg/dL (ref 70–99)
Potassium: 4.4 mEq/L (ref 3.5–5.1)
Sodium: 138 mEq/L (ref 135–145)
Total Bilirubin: 0.6 mg/dL (ref 0.2–1.2)
Total Protein: 6.3 g/dL (ref 6.0–8.3)

## 2020-08-30 LAB — CBC WITH DIFFERENTIAL/PLATELET
Basophils Absolute: 0 10*3/uL (ref 0.0–0.1)
Basophils Relative: 1 % (ref 0.0–3.0)
Eosinophils Absolute: 0.2 10*3/uL (ref 0.0–0.7)
Eosinophils Relative: 3.7 % (ref 0.0–5.0)
HCT: 46.6 % (ref 39.0–52.0)
Hemoglobin: 15.7 g/dL (ref 13.0–17.0)
Lymphocytes Relative: 23.2 % (ref 12.0–46.0)
Lymphs Abs: 1.1 10*3/uL (ref 0.7–4.0)
MCHC: 33.7 g/dL (ref 30.0–36.0)
MCV: 93.7 fl (ref 78.0–100.0)
Monocytes Absolute: 0.5 10*3/uL (ref 0.1–1.0)
Monocytes Relative: 10.2 % (ref 3.0–12.0)
Neutro Abs: 3 10*3/uL (ref 1.4–7.7)
Neutrophils Relative %: 61.9 % (ref 43.0–77.0)
Platelets: 238 10*3/uL (ref 150.0–400.0)
RBC: 4.98 Mil/uL (ref 4.22–5.81)
RDW: 13 % (ref 11.5–15.5)
WBC: 4.9 10*3/uL (ref 4.0–10.5)

## 2020-08-30 LAB — LIPID PANEL
Cholesterol: 165 mg/dL (ref 0–200)
HDL: 46.8 mg/dL (ref >39.00)
LDL Cholesterol: 106 mg/dL — ABNORMAL HIGH (ref 0–99)
NonHDL: 118.58
Total CHOL/HDL Ratio: 4
Triglycerides: 64 mg/dL (ref 0.0–149.0)
VLDL: 12.8 mg/dL (ref 0.0–40.0)

## 2020-08-30 LAB — T4, FREE: Free T4: 0.76 ng/dL (ref 0.60–1.60)

## 2020-08-30 LAB — PSA: PSA: 0.4 ng/mL (ref 0.10–4.00)

## 2020-08-30 LAB — TSH: TSH: 2.61 u[IU]/mL (ref 0.35–5.50)

## 2020-08-30 LAB — T3, FREE: T3, Free: 3.1 pg/mL (ref 2.3–4.2)

## 2020-08-30 LAB — VITAMIN D 25 HYDROXY (VIT D DEFICIENCY, FRACTURES): VITD: 73.41 ng/mL (ref 30.00–100.00)

## 2020-08-30 NOTE — Progress Notes (Signed)
Subjective:    Patient ID: Antonio Valdez, male    DOB: [birth year], 60 y.o.   MRN: [id number]  HPI Patient presents for yearly preventative medicine examination. He is a pleasant 60 year old male who  has a past medical history of ALLERGIC RHINITIS CAUSE UNSPECIFIED (09/05/2009), HERPES LABIALIS (09/05/2009), and UNSPECIFIED TACHYCARDIA (09/05/2009).  Seasonal Allergies - takes OTC Claritin   Vitamin D Deficiency - takes vitamin D5 1000 units daily Last vitamin D Lab Results  Component Value Date   VD25OH 81 08/03/2019   Hyperlipidemia - Did not start crestor  He denies myalgia or fatigue   Lab Results  Component Value Date   CHOL 205 (H) 08/03/2019   HDL 50 08/03/2019   LDLCALC 136 (H) 08/03/2019   TRIG 87 08/03/2019   CHOLHDL 4.1 08/03/2019   Low Testosterone - Was started on Testosterone replacement by his integrative medicine MD. First started with cream and the last month was started on injections   Hypothyroidism - was started on Synthroid 50 mcg for what sounds like subclinical hypothyroidism- prescribed by integrative medicine   All immunizations and health maintenance protocols were reviewed with the patient and needed orders were placed.  Appropriate screening laboratory values were ordered for the patient including screening of hyperlipidemia, renal function and hepatic function. If indicated by BPH, a PSA was ordered.  Medication reconciliation,  past medical history, social history, problem list and allergies were reviewed in detail with the patient  Goals were established with regard to weight loss, exercise, and  diet in compliance with medications. He is walking periodically but not much more than that.   Wt Readings from Last 3 Encounters:  08/30/20 177 lb (80.3 kg)  02/09/20 170 lb (77.1 kg)  08/03/19 166 lb (75.3 kg)   He is up to date on routine colon cancer screening   Review of Systems  Constitutional: Negative.   HENT: Negative.    Eyes: Negative.    Respiratory: Negative.    Cardiovascular: Negative.   Gastrointestinal: Negative.   Endocrine: Negative.   Genitourinary: Negative.   Musculoskeletal: Negative.   Skin: Negative.   Allergic/Immunologic: Negative.   Neurological: Negative.   Hematological: Negative.   Psychiatric/Behavioral: Negative.    All other systems reviewed and are negative.  Past Medical History:  Diagnosis Date   ALLERGIC RHINITIS CAUSE UNSPECIFIED 09/05/2009   HERPES LABIALIS 09/05/2009   UNSPECIFIED TACHYCARDIA 09/05/2009    Social History   Socioeconomic History   Marital status: Married    Spouse name: Not on file   Number of children: Not on file   Years of education: Not on file   Highest education level: Not on file  Occupational History   Not on file  Tobacco Use   Smoking status: Never   Smokeless tobacco: Never  Substance and Sexual Activity   Alcohol use: Yes    Alcohol/week: 0.0 standard drinks    Comment: occ.    Drug use: No   Sexual activity: Not on file  Other Topics Concern   Not on file  Social History Narrative   Works in [employer].    Married with no kids. Married for 11 years.       Social Determinants of Health   Financial Resource Strain: Not on file  Food Insecurity: Not on file  Transportation Needs: Not on file  Physical Activity: Not on file  Stress: Not on file  Social Connections: Not on file  Intimate Partner Violence: Not on file  Past Surgical History:  Procedure Laterality Date   COLONOSCOPY     HERNIA REPAIR     SPINE SURGERY      Family History  Problem Relation Age of Onset   Renal cancer Father    Colon polyps Father    Kidney cancer Father    Arthritis Mother    Hyperlipidemia Mother    Colon cancer Paternal Aunt    Esophageal cancer Neg Hx    Stomach cancer Neg Hx    Rectal cancer Neg Hx     No Known Allergies  Current Outpatient Medications on File Prior to Visit  Medication Sig Dispense Refill   Ascorbic Acid (VITAMIN C PO)  Take by mouth.     Cholecalciferol (VITAMIN D-3) 5000 units TABS Take 1 tablet by mouth daily.     levothyroxine (SYNTHROID) 50 MCG tablet SMARTSIG:1 Tablet(s) By Mouth     loratadine (CLARITIN) 10 MG tablet Take 10 mg by mouth daily.     Multiple Vitamin (MULTIVITAMIN) tablet Take 1 tablet by mouth daily.     rosuvastatin (CRESTOR) 5 MG tablet Take 1 tablet (5 mg total) by mouth at bedtime. 90 tablet 3   testosterone cypionate (DEPOTESTOSTERONE CYPIONATE) 200 MG/ML injection SMARTSIG:0.7 Milliliter(s) IM Once a Week     VITAMIN E PO Take by mouth.     No current facility-administered medications on file prior to visit.    BP 110/80   Pulse 74   Temp 97.9 F (36.6 C) (Oral)   Ht 5' 8.75" (1.746 m)   Wt 177 lb (80.3 kg)   SpO2 97%   BMI 26.33 kg/m        Objective:   Physical Exam Vitals and nursing note reviewed.  Constitutional:      General: He is not in acute distress.    Appearance: Normal appearance. He is well-developed and normal weight.  HENT:     Head: Normocephalic and atraumatic.     Right Ear: Tympanic membrane, ear canal and external ear normal. There is no impacted cerumen.     Left Ear: Tympanic membrane, ear canal and external ear normal. There is no impacted cerumen.     Nose: Nose normal. No congestion or rhinorrhea.     Mouth/Throat:     Mouth: Mucous membranes are moist.     Pharynx: Oropharynx is clear. No oropharyngeal exudate or posterior oropharyngeal erythema.  Eyes:     General:        Right eye: No discharge.        Left eye: No discharge.     Extraocular Movements: Extraocular movements intact.     Conjunctiva/sclera: Conjunctivae normal.     Pupils: Pupils are equal, round, and reactive to light.  Neck:     Vascular: No carotid bruit.     Trachea: No tracheal deviation.  Cardiovascular:     Rate and Rhythm: Normal rate and regular rhythm.     Pulses: Normal pulses.     Heart sounds: Normal heart sounds. No murmur heard.   No friction  rub. No gallop.  Pulmonary:     Effort: Pulmonary effort is normal. No respiratory distress.     Breath sounds: Normal breath sounds. No stridor. No wheezing, rhonchi or rales.  Chest:     Chest wall: No tenderness.  Abdominal:     General: Bowel sounds are normal. There is no distension.     Palpations: Abdomen is soft. There is no mass.     Tenderness:  There is no abdominal tenderness. There is no right CVA tenderness, left CVA tenderness, guarding or rebound.     Hernia: No hernia is present.  Musculoskeletal:        General: No swelling, tenderness, deformity or signs of injury. Normal range of motion.     Right lower leg: No edema.     Left lower leg: No edema.  Lymphadenopathy:     Cervical: No cervical adenopathy.  Skin:    General: Skin is warm and dry.     Capillary Refill: Capillary refill takes less than 2 seconds.     Coloration: Skin is not jaundiced or pale.     Findings: No bruising, erythema, lesion or rash.  Neurological:     General: No focal deficit present.     Mental Status: He is alert and oriented to person, place, and time.     Cranial Nerves: No cranial nerve deficit.     Sensory: No sensory deficit.     Motor: No weakness.     Coordination: Coordination normal.     Gait: Gait normal.     Deep Tendon Reflexes: Reflexes normal.  Psychiatric:        Mood and Affect: Mood normal.        Behavior: Behavior normal.        Thought Content: Thought content normal.        Judgment: Judgment normal.      Assessment & Plan:  1. Routine general medical examination at a health care facility - Encouraged lifestyle modifications. Needs more aerobic exercise - CBC with Differential/Platelet; Future - Comprehensive metabolic panel; Future - Lipid panel; Future - TSH; Future - TSH - Lipid panel - Comprehensive metabolic panel - CBC with Differential/Platelet  2. Prostate cancer screening  - PSA; Future - PSA  3. Vitamin D deficiency  - Vitamin D,  25-hydroxy; Future - Vitamin D, 25-hydroxy  4. Mixed hyperlipidemia - Likely needs to start statin  - CBC with Differential/Platelet; Future - Comprehensive metabolic panel; Future - Lipid panel; Future - TSH; Future - TSH - Lipid panel - Comprehensive metabolic panel - CBC with Differential/Platelet  5. Seasonal allergies - Continue with OTC   6. Hypothyroidism, unspecified type - Can follow up with integrative medicine MD for management  - TSH; Future - T3, Free; Future - T4, Free; Future - T4, Free - T3, Free - TSH  7. Low testosterone in male - Can follow up with integrative medicine for management   Dorothyann Peng, NP

## 2020-11-15 ENCOUNTER — Ambulatory Visit: Payer: Managed Care, Other (non HMO) | Admitting: Gastroenterology

## 2020-11-29 ENCOUNTER — Encounter: Payer: Self-pay | Admitting: Gastroenterology

## 2020-11-29 ENCOUNTER — Ambulatory Visit (INDEPENDENT_AMBULATORY_CARE_PROVIDER_SITE_OTHER): Payer: Managed Care, Other (non HMO) | Admitting: Gastroenterology

## 2020-11-29 VITALS — BP 134/70 | HR 84 | Ht 68.25 in | Wt 175.5 lb

## 2020-11-29 DIAGNOSIS — Z8371 Family history of colonic polyps: Secondary | ICD-10-CM | POA: Diagnosis not present

## 2020-11-29 DIAGNOSIS — Z83719 Family history of colon polyps, unspecified: Secondary | ICD-10-CM | POA: Insufficient documentation

## 2020-11-29 DIAGNOSIS — Z1211 Encounter for screening for malignant neoplasm of colon: Secondary | ICD-10-CM | POA: Diagnosis not present

## 2020-11-29 MED ORDER — SUTAB 1479-225-188 MG PO TABS: 1.0000 | ORAL_TABLET | ORAL | 0 refills | Status: DC

## 2020-11-29 NOTE — Patient Instructions (Signed)
If you are age 61 or older, your body mass index should be between 23-30. Your Body mass index is 26.49 kg/m. If this is out of the aforementioned range listed, please consider follow up with your Primary Care Provider.  If you are age 96 or younger, your body mass index should be between 19-25. Your Body mass index is 26.49 kg/m. If this is out of the aformentioned range listed, please consider follow up with your Primary Care Provider.   The [facility] providers would like to encourage you to use [facility] to communicate with providers for non-urgent requests or questions.  Due to long hold times on the telephone, sending your provider a message by [facility] may be faster and more efficient way to get a response. Please allow 48 business hours for a response.  Please remember that this is for non-urgent requests/questions.  PROCEDURES: You have been scheduled for a colonoscopy. Please follow the written instructions given to you at your visit today. Please pick up your prep supplies at the pharmacy within the next 1-3 days. If you use inhalers (even only as needed), please bring them with you on the day of your procedure.  It was great seeing you today! Thank you for entrusting me with your care and choosing [facility].  Alonza Bogus, PA-C

## 2020-11-29 NOTE — Progress Notes (Signed)
11/29/2020 Antonio Valdez [id number] 1960-02-09   HISTORY OF PRESENT ILLNESS: This is a 60 year old male who is a patient of Dr. Blanch Media.  He presents to our office today in order to discuss having a colonoscopy.  His last colonoscopy was in May 2017 that showed the following:  -Two 2 mm polyps in the descending colon and in the transverse colon, removed with a cold snare. Resected and retrieved. - The examination was otherwise normal.  - BENIGN COLORECTAL MUCOSA. - MELANOSIS COLI IS PRESENT. - NO DYSPLASIA OR MALIGNANCY IDENTIFIED.  He says that he feels fine and is not having any issues, but he would like to be cautious and would like to see about having another colonoscopy done now at a 5-year interval.  His father had a history of colon polyps, but not colon cancer.  Patient says he moves his bowels regularly.  He denies any rectal bleeding.  Past Medical History:  Diagnosis Date   ALLERGIC RHINITIS CAUSE UNSPECIFIED 09/05/2009   HERPES LABIALIS 09/05/2009   HLD (hyperlipidemia)    Hyperplastic colon polyp    UNSPECIFIED TACHYCARDIA 09/05/2009   Past Surgical History:  Procedure Laterality Date   ADENOIDECTOMY     COLONOSCOPY     INGUINAL HERNIA REPAIR Left    LUMBAR SPINE SURGERY      reports that he has never smoked. He has never used smokeless tobacco. He reports current alcohol use. He reports that he does not use drugs. family history includes Arthritis in his mother; Colitis in his mother; Colon cancer in his paternal aunt; Colon polyps in his brother and father; Heart attack in his father, maternal grandmother, and paternal grandmother; Hyperlipidemia in his brother and mother; Kidney cancer in his father; Renal cancer in his father. No Known Allergies    Outpatient Encounter Medications as of 11/29/2020  Medication Sig   Ascorbic Acid (VITAMIN C PO) Take by mouth.   Barberry-Oreg Grape-Goldenseal (BERBERINE COMPLEX PO) Take 1 capsule by mouth 2 (two) times  daily.   Cholecalciferol (VITAMIN D-3) 5000 units TABS Take 1 tablet by mouth daily.   levothyroxine (SYNTHROID) 100 MCG tablet Take 100 mcg by mouth every morning.   loratadine (CLARITIN) 10 MG tablet Take 10 mg by mouth as needed.   Multiple Vitamin (MULTIVITAMIN) tablet Take 1 tablet by mouth daily.   testosterone cypionate (DEPOTESTOSTERONE CYPIONATE) 200 MG/ML injection 0.4 ml once a week   VITAMIN E PO Take by mouth.   [DISCONTINUED] levothyroxine (SYNTHROID) 50 MCG tablet SMARTSIG:1 Tablet(s) By Mouth   No facility-administered encounter medications on file as of 11/29/2020.     REVIEW OF SYSTEMS  : All other systems reviewed and negative except where noted in the History of Present Illness.   PHYSICAL EXAM: BP 134/70 (BP Location: Left Arm, Patient Position: Sitting, Cuff Size: Normal)   Pulse 84   Ht 5' 8.25" (1.734 m) Comment: height measured without shoes  Wt 175 lb 8 oz (79.6 kg)   BMI 26.49 kg/m  General: Well developed white male in no acute distress Head: Normocephalic and atraumatic Eyes:  Sclerae anicteric, conjunctiva pink. Ears: Normal auditory acuity Lungs: Clear throughout to auscultation; no W/R/R. Heart: Regular rate and rhythm; no M/R/G. Abdomen: Soft, non-distended.  BS present.  Non-tender. Rectal:  Will be done at the time of colonoscopy. Musculoskeletal: Symmetrical with no gross deformities  Skin: No lesions on visible extremities Extremities: No edema  Neurological: Alert oriented x 4, grossly non-focal Psychological:  Alert and cooperative.  Normal mood and affect  ASSESSMENT AND PLAN: *Family history of colon polyps: Patient reports that his father had a history of precancerous colon polyps, but not colon cancer. *CRC screening: Patient had a colonoscopy in 2017 that showed 2 polyps, but on pathology they were benign colorectal mucosa with melanosis coli.  Repeat was recommended 10-year interval.  Patient understands a colonoscopy is not  necessarily indicated at this time.  He would like to go ahead and proceed with scheduling and to check with his insurance company in regards to approval/payment.  We will schedule with Dr. Henrene Pastor tentatively.  The risks, benefits, and alternatives to colonoscopy were discussed with the patient and he consents to proceed.   CC:  Dorothyann Peng, NP

## 2020-11-29 NOTE — Progress Notes (Signed)
Noted  

## 2021-01-30 ENCOUNTER — Encounter: Payer: Self-pay | Admitting: Internal Medicine

## 2021-02-05 ENCOUNTER — Encounter: Payer: Managed Care, Other (non HMO) | Admitting: Internal Medicine

## 2021-06-01 ENCOUNTER — Telehealth: Payer: Self-pay | Admitting: Adult Health

## 2021-06-01 NOTE — Telephone Encounter (Signed)
Pt call and stated he want to know if he can come in today and do a urine sample because he saw blood in his stool and want a call back today. ?

## 2021-06-05 ENCOUNTER — Ambulatory Visit: Payer: Managed Care, Other (non HMO) | Admitting: Adult Health

## 2021-07-13 ENCOUNTER — Telehealth: Payer: Self-pay | Admitting: Internal Medicine

## 2021-08-22 ENCOUNTER — Encounter: Payer: Self-pay | Admitting: Internal Medicine

## 2021-08-22 ENCOUNTER — Ambulatory Visit (INDEPENDENT_AMBULATORY_CARE_PROVIDER_SITE_OTHER): Payer: Managed Care, Other (non HMO) | Admitting: Internal Medicine

## 2021-08-22 VITALS — BP 114/66 | HR 70 | Ht 69.0 in | Wt 179.4 lb

## 2021-08-22 DIAGNOSIS — Z800 Family history of malignant neoplasm of digestive organs: Secondary | ICD-10-CM | POA: Diagnosis not present

## 2021-08-22 DIAGNOSIS — Z1211 Encounter for screening for malignant neoplasm of colon: Secondary | ICD-10-CM

## 2021-08-22 DIAGNOSIS — Z8371 Family history of colonic polyps: Secondary | ICD-10-CM | POA: Diagnosis not present

## 2021-08-22 MED ORDER — NA SULFATE-K SULFATE-MG SULF 17.5-3.13-1.6 GM/177ML PO SOLN: 1.0000 | Freq: Once | ORAL | 0 refills | Status: AC

## 2021-08-22 NOTE — Progress Notes (Signed)
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  Patient underwent colonoscopy in 2010 and 2017.  Both examinations were negative for neoplasia.  Most recent examination revealed 2 diminutive polyps which were non-adenomatous.  The recommendation for follow-up was 10 years.  However, the patient is concerned based on his family history, at that consider interval colonoscopy would be reasonable.  He is also had a friend recently diagnosed with colon cancer.  Fortunately, he has no symptoms.  No change in bowel habits or bleeding.  No abdominal pain.  He does tell me that his aunt was diagnosed with colon cancer.  Also, his father has had precancerous polyps at a young age.  REVIEW OF SYSTEMS:  All non-GI ROS negative unless otherwise stated in the HPI except for allergies, sleeping problems  Past Medical History:  Diagnosis Date   ALLERGIC RHINITIS CAUSE UNSPECIFIED 09/05/2009   HERPES LABIALIS 09/05/2009   HLD (hyperlipidemia)    Hyperplastic colon polyp    UNSPECIFIED TACHYCARDIA 09/05/2009    Past Surgical History:  Procedure Laterality Date   ADENOIDECTOMY     COLONOSCOPY     INGUINAL HERNIA REPAIR Left    LUMBAR SPINE SURGERY      Social History Antonio Valdez  reports that he has never smoked. He has never used smokeless tobacco. He reports current alcohol use. He reports that he does not use drugs.  family history includes Arthritis in his mother; Colitis in his mother; Colon cancer in his paternal aunt; Colon polyps in his brother and father; Heart attack in his father, maternal grandmother, and paternal grandmother; Hyperlipidemia in his brother and mother; Kidney cancer in his father; Renal cancer in his father.  No Known Allergies     PHYSICAL EXAMINATION: Vital signs: BP 114/66   Pulse 70   Ht '5\' 9"'$  (1.753 m)   Wt 179 lb  6 oz (81.4 kg)   BMI 26.49 kg/m   Constitutional: generally well-appearing, no acute distress Psychiatric: alert and oriented x3, cooperative Eyes: extraocular movements intact, anicteric, conjunctiva pink Mouth: oral pharynx moist, no lesions Neck: supple no lymphadenopathy Cardiovascular: heart regular rate and rhythm, no murmur Lungs: clear to auscultation bilaterally Abdomen: soft, nontender, nondistended, no obvious ascites, no peritoneal signs, normal bowel sounds, no organomegaly Rectal: Omitted Extremities: no clubbing, cyanosis, or lower extremity edema bilaterally Skin: no lesions on visible extremities Neuro: No focal deficits.  Cranial nerves intact  ASSESSMENT:  1.  Family history of colon cancer 2.  Family history of colon polyps in first-degree relative less than age 38 3.  Previous colonoscopy 2010 and 2017 negative for neoplasia.  Patient requests surveillance colonoscopy at this time.  This is reasonable given his higher than baseline risk for colorectal cancer   PLAN:  1.  Screening colonoscopy.The nature of the procedure, as well as the risks, benefits, and alternatives were carefully and thoroughly reviewed with the patient. Ample time for discussion and questions allowed. The patient understood, was satisfied, and agreed to proceed.

## 2021-08-22 NOTE — Patient Instructions (Signed)
You have been scheduled for a colonoscopy. Please follow written instructions given to you at your visit today.  Please pick up your prep supplies at the pharmacy within the next 1-3 days. If you use inhalers (even only as needed), please bring them with you on the day of your procedure.   

## 2021-09-04 ENCOUNTER — Ambulatory Visit (INDEPENDENT_AMBULATORY_CARE_PROVIDER_SITE_OTHER): Payer: Managed Care, Other (non HMO) | Admitting: Cardiovascular Disease

## 2021-09-04 ENCOUNTER — Encounter: Payer: Self-pay | Admitting: Cardiovascular Disease

## 2021-09-04 VITALS — BP 112/70 | HR 74 | Ht 70.0 in | Wt 179.4 lb

## 2021-09-04 DIAGNOSIS — Z8249 Family history of ischemic heart disease and other diseases of the circulatory system: Secondary | ICD-10-CM | POA: Diagnosis not present

## 2021-09-04 DIAGNOSIS — E782 Mixed hyperlipidemia: Secondary | ICD-10-CM | POA: Diagnosis not present

## 2021-09-04 NOTE — Patient Instructions (Signed)
Medication Instructions:  Your physician recommends that you continue on your current medications as directed. Please refer to the Current Medication list given to you today.  *If you need a refill on your cardiac medications before your next appointment, please call your pharmacy*   Follow-Up: At [facility], you and your health needs are our priority.  As part of our continuing mission to provide you with exceptional heart care, we have created designated Provider Care Teams.  These Care Teams include your primary Cardiologist (physician) and Advanced Practice Providers (APPs -  Physician Assistants and Nurse Practitioners) who all work together to provide you with the care you need, when you need it.  We recommend signing up for the patient portal called "MyChart".  Sign up information is provided on this After Visit Summary.  MyChart is used to connect with patients for Virtual Visits (Telemedicine).  Patients are able to view lab/test results, encounter notes, upcoming appointments, etc.  Non-urgent messages can be sent to your provider as well.   To learn more about what you can do with MyChart, go to [email or web address].    Your next appointment:   2 year(s)  The format for your next appointment:   In Person  Provider:   Quay Burow, MD

## 2021-09-04 NOTE — Assessment & Plan Note (Signed)
Family history of heart disease with father who had a myocardial infarction in his 58s.  We did do a coronary calcium score on him 02/24/2020 which was 0.  He is completely asymptomatic.

## 2021-09-04 NOTE — Assessment & Plan Note (Signed)
History of hyperlipidemia not on statin therapy with lipid profile performed 08/30/2020 revealing total cholesterol 165, LDL of 106 and HDL of 46.

## 2021-09-04 NOTE — Progress Notes (Signed)
09/04/2021 TAG WURTZ   1960-08-29  [id number]  Primary Physician Carlisle Cater, Tommi Rumps, NP Primary Cardiologist: Lorretta Harp MD [name], [place]  HPI:  Antonio Valdez is a 61 y.o.  thin and fit appearing married Caucasian male with no children referred by Dr. Carlisle Cater, his PCP, to be established in the practice because of family history of heart disease.  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  I last saw him in the office 02/09/2020.  His only risk factor is mild hyperlipidemia currently untreated and family history with a father who had a myocardial infarction in his 11s.  He is never had a heart attack or stroke.  I did get a coronary calcium score on him 02/24/2020 which was 0.  Since I saw him a year and half ago he is completely asymptomatic.  He specifically denies chest pain or shortness of breath.  He does not smoke.  His most recent lipid profile performed 08/30/2020 revealed total cholesterol 165, LDL 116 and HDL 46.   Current Meds  Medication Sig   Ascorbic Acid (VITAMIN C PO) Take by mouth.   Barberry-Oreg Grape-Goldenseal (BERBERINE COMPLEX PO) Take 1 capsule by mouth 2 (two) times daily.   Cholecalciferol (VITAMIN D-3) 5000 units TABS Take 1 tablet by mouth daily.   levothyroxine (SYNTHROID) 100 MCG tablet Take 100 mcg by mouth every morning.   Multiple Vitamin (MULTIVITAMIN) tablet Take 1 tablet by mouth daily.   Sodium Sulfate-Mag Sulfate-KCl (SUTAB) [phone] MG TABS Take 1 kit by mouth as directed.   Testosterone Undecanoate 200 MG CAPS Take 200 mg by mouth daily in the afternoon.   VITAMIN E PO Take by mouth.     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    Married with no kids. Married for 11 years.       Social Determinants of Health   Financial Resource Strain: Not on file  Food Insecurity: Not on file  Transportation Needs: Not on file  Physical Activity: Not on file  Stress: Not on file  Social Connections: Not on file  Intimate Partner Violence: Not on file     Review of Systems: General: negative for chills, fever, night sweats or weight changes.  Cardiovascular: negative for chest pain, dyspnea on exertion, edema, orthopnea, palpitations, paroxysmal nocturnal dyspnea or shortness of breath Dermatological: negative for rash Respiratory: negative for cough or wheezing Urologic: negative for hematuria Abdominal: negative for nausea, vomiting, diarrhea, bright red blood per rectum, melena, or hematemesis Neurologic: negative for visual changes, syncope, or dizziness All other systems reviewed and are otherwise negative except as noted above.    Blood pressure 112/70, pulse 74, height _0  (1.778 m), weight 179 lb 6.4 oz (81.4 kg), SpO2 98 %.  General appearance: alert and no distress Neck: no adenopathy, no carotid  bruit, no JVD, supple, symmetrical, trachea midline, and thyroid not enlarged, symmetric, no tenderness/mass/nodules Lungs: clear to auscultation bilaterally Heart: regular rate and rhythm, S1, S2 normal, no murmur, click, rub or gallop Extremities: extremities normal, atraumatic, no cyanosis or edema Pulses: 2+ and symmetric Skin: Skin color, texture, turgor normal. No rashes or lesions Neurologic: Grossly normal  EKG sinus rhythm at 74 without ST or T wave changes.  Personally reviewed this EKG.  ASSESSMENT AND PLAN:   Hyperlipidemia History of hyperlipidemia not on statin therapy  with lipid profile performed 08/30/2020 revealing total cholesterol 165, LDL of 106 and HDL of 46.  Family history of heart disease Family history of heart disease with father who had a myocardial infarction in his 50s.  We did do a coronary calcium score on him 02/24/2020 which was 0.  He is completely asymptomatic.     Lorretta Harp MD FACP,FACC,FAHA, [facility] 09/04/2021 4:30 PM

## 2021-09-05 ENCOUNTER — Other Ambulatory Visit: Payer: Self-pay | Admitting: Adult Health

## 2021-09-05 ENCOUNTER — Ambulatory Visit: Payer: Managed Care, Other (non HMO) | Admitting: Adult Health

## 2021-09-05 ENCOUNTER — Encounter: Payer: Self-pay | Admitting: Adult Health

## 2021-09-05 VITALS — BP 110/70 | HR 90 | Temp 98.5°F | Ht 68.75 in | Wt 176.0 lb

## 2021-09-05 DIAGNOSIS — E559 Vitamin D deficiency, unspecified: Secondary | ICD-10-CM | POA: Diagnosis not present

## 2021-09-05 DIAGNOSIS — Z0000 Encounter for general adult medical examination without abnormal findings: Secondary | ICD-10-CM

## 2021-09-05 DIAGNOSIS — R7989 Other specified abnormal findings of blood chemistry: Secondary | ICD-10-CM

## 2021-09-05 DIAGNOSIS — Z125 Encounter for screening for malignant neoplasm of prostate: Secondary | ICD-10-CM | POA: Diagnosis not present

## 2021-09-05 DIAGNOSIS — E039 Hypothyroidism, unspecified: Secondary | ICD-10-CM | POA: Diagnosis not present

## 2021-09-05 DIAGNOSIS — E782 Mixed hyperlipidemia: Secondary | ICD-10-CM | POA: Diagnosis not present

## 2021-09-05 DIAGNOSIS — J302 Other seasonal allergic rhinitis: Secondary | ICD-10-CM

## 2021-09-05 LAB — CBC WITH DIFFERENTIAL/PLATELET
Basophils Absolute: 0 10*3/uL (ref 0.0–0.1)
Basophils Relative: 0.8 % (ref 0.0–3.0)
Eosinophils Absolute: 0.3 10*3/uL (ref 0.0–0.7)
Eosinophils Relative: 5 % (ref 0.0–5.0)
HCT: 44.9 % (ref 39.0–52.0)
Hemoglobin: 15.4 g/dL (ref 13.0–17.0)
Lymphocytes Relative: 22.5 % (ref 12.0–46.0)
Lymphs Abs: 1.1 10*3/uL (ref 0.7–4.0)
MCHC: 34.3 g/dL (ref 30.0–36.0)
MCV: 93.7 fl (ref 78.0–100.0)
Monocytes Absolute: 0.5 10*3/uL (ref 0.1–1.0)
Monocytes Relative: 10.2 % (ref 3.0–12.0)
Neutro Abs: 3.1 10*3/uL (ref 1.4–7.7)
Neutrophils Relative %: 61.5 % (ref 43.0–77.0)
Platelets: 247 10*3/uL (ref 150.0–400.0)
RBC: 4.8 Mil/uL (ref 4.22–5.81)
RDW: 13.3 % (ref 11.5–15.5)
WBC: 5 10*3/uL (ref 4.0–10.5)

## 2021-09-05 LAB — COMPREHENSIVE METABOLIC PANEL
ALT: 21 U/L (ref 0–53)
AST: 17 U/L (ref 0–37)
Albumin: 4.3 g/dL (ref 3.5–5.2)
Alkaline Phosphatase: 55 U/L (ref 39–117)
BUN: 14 mg/dL (ref 6–23)
CO2: 28 mEq/L (ref 19–32)
Calcium: 9.2 mg/dL (ref 8.4–10.5)
Chloride: 102 mEq/L (ref 96–112)
Creatinine, Ser: 1.02 mg/dL (ref 0.40–1.50)
GFR: 79.05 mL/min (ref >60.00)
Glucose, Bld: 105 mg/dL — ABNORMAL HIGH (ref 70–99)
Potassium: 4.4 mEq/L (ref 3.5–5.1)
Sodium: 139 mEq/L (ref 135–145)
Total Bilirubin: 0.5 mg/dL (ref 0.2–1.2)
Total Protein: 6.5 g/dL (ref 6.0–8.3)

## 2021-09-05 LAB — LIPID PANEL
Cholesterol: 189 mg/dL (ref 0–200)
HDL: 40.4 mg/dL (ref >39.00)
LDL Cholesterol: 134 mg/dL — ABNORMAL HIGH (ref 0–99)
NonHDL: 148.35
Total CHOL/HDL Ratio: 5
Triglycerides: 71 mg/dL (ref 0.0–149.0)
VLDL: 14.2 mg/dL (ref 0.0–40.0)

## 2021-09-05 LAB — VITAMIN D 25 HYDROXY (VIT D DEFICIENCY, FRACTURES): VITD: 63.45 ng/mL (ref 30.00–100.00)

## 2021-09-05 LAB — TSH: TSH: 0.16 u[IU]/mL — ABNORMAL LOW (ref 0.35–5.50)

## 2021-09-05 LAB — PSA: PSA: 0.41 ng/mL (ref 0.10–4.00)

## 2021-09-05 NOTE — Patient Instructions (Addendum)
It was great seeing you today   We will follow up with you regarding your lab work   Please let me know if you need anything   [facility]

## 2021-09-05 NOTE — Progress Notes (Signed)
Subjective:    Patient ID: Antonio Valdez, male    DOB: 02-12-1960, 61 y.o.   MRN: [id number]  HPI Patient presents for yearly preventative medicine examination. He is a pleasant 62 year old male who  has a past medical history of ALLERGIC RHINITIS CAUSE UNSPECIFIED (09/05/2009), HERPES LABIALIS (09/05/2009), HLD (hyperlipidemia), Hyperplastic colon polyp, and UNSPECIFIED TACHYCARDIA (09/05/2009).  Hyperlipidemia - untreated and asympomatic  He was seen by cardiology recently due to family history of heart disease.  In February 2022 he had a coronary calcium score which was 0. Lab Results  Component Value Date   CHOL 165 08/30/2020   HDL 46.80 08/30/2020   LDLCALC 106 (H) 08/30/2020   TRIG 64.0 08/30/2020   CHOLHDL 4 08/30/2020   Seasonal allergies - takes OTC claritin   Vitamin D Deficiency - takes Vitamin D 1000 units daily.   Hypothyroidism -Managed with synthroid 100 mcg daily.  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  Appropriate screening laboratory values were ordered for the patient including screening of hyperlipidemia, renal function and hepatic function. If indicated by BPH, a PSA was ordered.  Medication reconciliation,  past medical history, social history, problem list and allergies were reviewed in detail with the patient  Goals were established with regard to weight loss, exercise, and  diet in compliance with medications  Wt Readings from Last 3 Encounters:  09/05/21 176 lb (79.8 kg)  09/04/21 179 lb 6.4 oz (81.4 kg)  08/22/21 179 lb 6 oz (81.4 kg)   He is up to date on routine colon cancer screening    Review of Systems  Constitutional: Negative.   HENT: Negative.    Eyes: Negative.   Respiratory: Negative.    Cardiovascular: Negative.    Gastrointestinal: Negative.   Endocrine: Negative.   Genitourinary: Negative.   Musculoskeletal: Negative.   Skin: Negative.   Allergic/Immunologic: Negative.   Neurological: Negative.   Hematological: Negative.   Psychiatric/Behavioral: Negative.    All other systems reviewed and are negative.  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    Married with no kids. Married for 11 years.       Social Determinants of Health   Financial Resource Strain: Not on file  Food Insecurity: Not on file  Transportation Needs: Not on file  Physical Activity: Not on file  Stress: Not on file  Social Connections: Not on file  Intimate Partner Violence: Not on file    Past Surgical History:  Procedure Laterality Date   ADENOIDECTOMY     COLONOSCOPY     INGUINAL HERNIA REPAIR Left    LUMBAR SPINE SURGERY      Family History  Problem Relation Age of Onset   Arthritis Mother    Hyperlipidemia Mother    Colitis Mother    Renal cancer Father    Colon polyps Father    Kidney cancer Father    Heart attack Father    Colon polyps Brother    Hyperlipidemia Brother    Heart attack Maternal Grandmother    Heart attack Paternal Grandmother    Colon cancer Paternal Aunt     Esophageal cancer Neg Hx    Stomach cancer Neg Hx    Rectal cancer Neg Hx     No Known Allergies  Current Outpatient Medications on File Prior to Visit  Medication Sig Dispense Refill   Ascorbic Acid (VITAMIN C PO) Take by mouth.     Barberry-Oreg Grape-Goldenseal (BERBERINE COMPLEX PO) Take 1 capsule by mouth 2 (two) times daily.     Cholecalciferol (VITAMIN D-3) 5000 units TABS Take 1 tablet by mouth daily.     levothyroxine (SYNTHROID) 100 MCG tablet Take 100 mcg by mouth every morning.     Multiple Vitamin (MULTIVITAMIN) tablet Take 1 tablet by mouth daily.     Sodium Sulfate-Mag Sulfate-KCl (SUTAB) [phone] MG TABS Take 1 kit by mouth as directed. 24 tablet 0   Testosterone Undecanoate 200 MG CAPS Take 200 mg by mouth daily in the afternoon.     VITAMIN E PO Take by mouth.     No current facility-administered medications on file prior to visit.    BP 110/70   Pulse 90   Temp 98.5 F (36.9 C) (Oral)   Ht 5' 8.75" (1.746 m)   Wt 176 lb (79.8 kg)   SpO2 97%   BMI 26.18 kg/m       Objective:   Physical Exam Vitals and nursing note reviewed.  Constitutional:      General: He is not in acute distress.    Appearance: Normal appearance. He is well-developed and normal weight.  HENT:     Head: Normocephalic and atraumatic.     Right Ear: Tympanic membrane, ear canal and external ear normal. There is no impacted cerumen.     Left Ear: Tympanic membrane, ear canal and external ear normal. There is no impacted cerumen.     Nose: Nose normal. No congestion or rhinorrhea.     Mouth/Throat:     Mouth: Mucous membranes are moist.     Pharynx: Oropharynx is clear. No oropharyngeal exudate or posterior oropharyngeal erythema.  Eyes:     General:        Right eye: No discharge.        Left eye: No discharge.     Extraocular Movements: Extraocular movements intact.     Conjunctiva/sclera: Conjunctivae normal.     Pupils: Pupils are equal, round, and reactive to light.   Neck:     Vascular: No carotid bruit.     Trachea: No tracheal deviation.  Cardiovascular:     Rate and Rhythm: Normal rate and regular rhythm.     Pulses: Normal pulses.     Heart sounds: Normal heart sounds. No murmur heard.    No friction rub. No gallop.  Pulmonary:     Effort: Pulmonary effort is normal. No respiratory distress.     Breath sounds: Normal breath sounds. No stridor. No wheezing,  rhonchi or rales.  Chest:     Chest wall: No tenderness.  Abdominal:     General: Abdomen is flat. Bowel sounds are normal. There is no distension.     Palpations: Abdomen is soft. There is no mass.     Tenderness: There is no abdominal tenderness. There is no right CVA tenderness, left CVA tenderness, guarding or rebound.     Hernia: No hernia is present.  Musculoskeletal:        General: No swelling, tenderness, deformity or signs of injury. Normal range of motion.     Right lower leg: No edema.     Left lower leg: No edema.  Lymphadenopathy:     Cervical: No cervical adenopathy.  Skin:    General: Skin is warm and dry.     Capillary Refill: Capillary refill takes less than 2 seconds.     Coloration: Skin is not jaundiced or pale.     Findings: No bruising, erythema, lesion or rash.  Neurological:     General: No focal deficit present.     Mental Status: He is alert and oriented to person, place, and time.     Cranial Nerves: No cranial nerve deficit.     Sensory: No sensory deficit.     Motor: No weakness.     Coordination: Coordination normal.     Gait: Gait normal.     Deep Tendon Reflexes: Reflexes normal.  Psychiatric:        Mood and Affect: Mood normal.        Behavior: Behavior normal.        Thought Content: Thought content normal.        Judgment: Judgment normal.       Assessment & Plan:  1. Routine general medical examination at a health care facility - Benign exam  - Follow up in one year  - CBC with Differential/Platelet; Future - Comprehensive metabolic  panel; Future - Lipid panel; Future - TSH; Future  2. Prostate cancer screening  - PSA; Future  3. Vitamin D deficiency  - VITAMIN D 25 Hydroxy (Vit-D Deficiency, Fractures); Future  4. Mixed hyperlipidemia  - CBC with Differential/Platelet; Future - Comprehensive metabolic panel; Future - Lipid panel; Future - TSH; Future  5. Hypothyroidism, unspecified type - Per integrative medicine  - CBC with Differential/Platelet; Future - Comprehensive metabolic panel; Future - Lipid panel; Future - TSH; Future  6. Seasonal allergies - Continue OTC   7. Low testosterone in male - per integrative medicine   Dorothyann Peng, NP

## 2021-09-06 ENCOUNTER — Telehealth: Payer: Self-pay | Admitting: Adult Health

## 2021-09-06 NOTE — Telephone Encounter (Signed)
Pt called, returning CMA's call. CMA was unavailable. Pt asked that CMA call back at her earliest convenience. 

## 2021-09-06 NOTE — Telephone Encounter (Signed)
Patient notified of update  and verbalized understanding. 

## 2021-10-16 ENCOUNTER — Encounter: Payer: Self-pay | Admitting: Internal Medicine

## 2021-10-16 ENCOUNTER — Ambulatory Visit (AMBULATORY_SURGERY_CENTER): Payer: Managed Care, Other (non HMO) | Admitting: Internal Medicine

## 2021-10-16 VITALS — BP 102/67 | HR 79 | Temp 96.8°F | Resp 14 | Ht 69.0 in | Wt 179.0 lb

## 2021-10-16 DIAGNOSIS — Z800 Family history of malignant neoplasm of digestive organs: Secondary | ICD-10-CM | POA: Diagnosis not present

## 2021-10-16 DIAGNOSIS — Z1211 Encounter for screening for malignant neoplasm of colon: Secondary | ICD-10-CM | POA: Diagnosis not present

## 2021-10-16 DIAGNOSIS — Z8371 Family history of colonic polyps: Secondary | ICD-10-CM

## 2021-10-16 MED ORDER — SODIUM CHLORIDE 0.9 % IV SOLN: 500.0000 mL | Freq: Once | INTRAVENOUS | Status: DC

## 2021-10-16 NOTE — Progress Notes (Signed)
Pt's states no medical or surgical changes since previsit or office visit. 

## 2021-10-16 NOTE — Progress Notes (Signed)
PT taken to PACU. Monitors in place. VSS. Report given to RN. 

## 2021-10-16 NOTE — Op Note (Signed)
[place] Patient Name: Antonio Valdez Procedure Date: 10/16/2021 10:01 AM MRN: [id number] Endoscopist: Docia Chuck. Henrene Pastor , MD Age: 61 Referring MD:  Date of Birth: 08-Oct-1960 Gender: Male Account #: [id number] Procedure:                Colonoscopy Indications:              Colon cancer screening in patient at increased                            risk: Family history of 1st-degree relative with                            colon polyps before age 1 years, Colon cancer                            screening in patient at increased risk: Family                            history of colorectal cancer in 2nd degree                            relative. Previous examinations 2010 and 2017 were                            normal. Medicines:                Monitored Anesthesia Care Procedure:                Pre-Anesthesia Assessment:                           - Prior to the procedure, a History and Physical                            was performed, and patient medications and                            allergies were reviewed. The patient's tolerance of                            previous anesthesia was also reviewed. The risks                            and benefits of the procedure and the sedation                            options and risks were discussed with the patient.                            All questions were answered, and informed consent                            was obtained. Prior Anticoagulants: The patient has  taken no previous anticoagulant or antiplatelet                            agents. ASA Grade Assessment: II - A patient with                            mild systemic disease. After reviewing the risks                            and benefits, the patient was deemed in                            satisfactory condition to undergo the procedure.                           After obtaining informed consent, the colonoscope                             was passed under direct vision. Throughout the                            procedure, the patient's blood pressure, pulse, and                            oxygen saturations were monitored continuously. The                            Olympus CF-HQ190L [id number]) Colonoscope was                            introduced through the anus and advanced to the the                            cecum, identified by appendiceal orifice and                            ileocecal valve. The ileocecal valve, appendiceal                            orifice, and rectum were photographed. The quality                            of the bowel preparation was good. The colonoscopy                            was performed without difficulty. The patient                            tolerated the procedure well. The bowel preparation                            used was SUPREP via split dose instruction. Scope In: 10:10:57 AM Scope Out: 10:29:45 AM Scope Withdrawal Time: 0 hours 11 minutes 38 seconds  Total  Procedure Duration: 0 hours 18 minutes 48 seconds  Findings:                 The entire examined colon appeared normal on direct                            and retroflexion views. Complications:            No immediate complications. Estimated blood loss:                            None. Estimated Blood Loss:     Estimated blood loss: none. Impression:               - The entire examined colon is normal on direct and                            retroflexion views.                           - No specimens collected. Recommendation:           - Repeat colonoscopy in 10 years for screening                            purposes.                           - Patient has a contact number available for                            emergencies. The signs and symptoms of potential                            delayed complications were discussed with the                            patient. Return to normal activities tomorrow.                             Written discharge instructions were provided to the                            patient.                           - Resume previous diet.                           - Continue present medications. Docia Chuck. Henrene Pastor, MD 10/16/2021 10:34:41 AM This report has been signed electronically.

## 2021-10-16 NOTE — Progress Notes (Signed)
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  Patient underwent colonoscopy in 2010 and 2017.  Both examinations were negative for neoplasia.  Most recent examination revealed 2 diminutive polyps which were non-adenomatous.  The recommendation for follow-up was 10 years.  However, the patient is concerned based on his family history, at that consider interval colonoscopy would be reasonable.  He is also had a friend recently diagnosed with colon cancer.  Fortunately, he has no symptoms.  No change in bowel habits or bleeding.  No abdominal pain.  He does tell me that his aunt was diagnosed with colon cancer.  Also, his father has had precancerous polyps at a young age.   REVIEW OF SYSTEMS:   All non-GI ROS negative unless otherwise stated in the HPI except for allergies, sleeping problems       Past Medical History:  Diagnosis Date   ALLERGIC RHINITIS CAUSE UNSPECIFIED 09/05/2009   HERPES LABIALIS 09/05/2009   HLD (hyperlipidemia)     Hyperplastic colon polyp     UNSPECIFIED TACHYCARDIA 09/05/2009           Past Surgical History:  Procedure Laterality Date   ADENOIDECTOMY       COLONOSCOPY       INGUINAL HERNIA REPAIR Left     LUMBAR SPINE SURGERY          Social History Antonio Valdez  reports that he has never smoked. He has never used smokeless tobacco. He reports current alcohol use. He reports that he does not use drugs.   family history includes Arthritis in his mother; Colitis in his mother; Colon cancer in his paternal aunt; Colon polyps in his brother and father; Heart attack in his father, maternal grandmother, and paternal grandmother; Hyperlipidemia in his brother and mother; Kidney cancer in his father; Renal cancer in his father.   No Known Allergies       PHYSICAL EXAMINATION: Vital signs: BP 114/66   Pulse 70    Ht '5\' 9"'$  (1.753 m)   Wt 179 lb 6 oz (81.4 kg)   BMI 26.49 kg/m   Constitutional: generally well-appearing, no acute distress Psychiatric: alert and oriented x3, cooperative Eyes: extraocular movements intact, anicteric, conjunctiva pink Mouth: oral pharynx moist, no lesions Neck: supple no lymphadenopathy Cardiovascular: heart regular rate and rhythm, no murmur Lungs: clear to auscultation bilaterally Abdomen: soft, nontender, nondistended, no obvious ascites, no peritoneal signs, normal bowel sounds, no organomegaly Rectal: Omitted Extremities: no clubbing, cyanosis, or lower extremity edema bilaterally Skin: no lesions on visible extremities Neuro: No focal deficits.  Cranial nerves intact   ASSESSMENT:   1.  Family history of colon cancer 2.  Family history of colon polyps in first-degree relative less than age 60 3.  Previous colonoscopy 2010 and 2017 negative for neoplasia.  Patient requests surveillance colonoscopy at this time.  This is reasonable given his higher than baseline risk for colorectal cancer     PLAN:   1.  Screening colonoscopy.The nature of the procedure, as well as the risks, benefits, and alternatives were carefully and thoroughly reviewed with the patient. Ample time for discussion and questions allowed. The patient understood, was satisfied, and agreed to proceed.

## 2021-10-16 NOTE — Patient Instructions (Signed)
Please read handouts provided. Continue present medications. Repeat colonoscopy in 10 years for screening. Resume previous diet.   YOU HAD AN ENDOSCOPIC PROCEDURE TODAY AT THE [facility]:   Refer to the procedure report that was given to you for any specific questions about what was found during the examination.  If the procedure report does not answer your questions, please call your gastroenterologist to clarify.  If you requested that your care partner not be given the details of your procedure findings, then the procedure report has been included in a sealed envelope for you to review at your convenience later.  YOU SHOULD EXPECT: Some feelings of bloating in the abdomen. Passage of more gas than usual.  Walking can help get rid of the air that was put into your GI tract during the procedure and reduce the bloating. If you had a lower endoscopy (such as a colonoscopy or flexible sigmoidoscopy) you may notice spotting of blood in your stool or on the toilet paper. If you underwent a bowel prep for your procedure, you may not have a normal bowel movement for a few days.  Please Note:  You might notice some irritation and congestion in your nose or some drainage.  This is from the oxygen used during your procedure.  There is no need for concern and it should clear up in a day or so.  SYMPTOMS TO REPORT IMMEDIATELY:  Following lower endoscopy (colonoscopy or flexible sigmoidoscopy):  Excessive amounts of blood in the stool  Significant tenderness or worsening of abdominal pains  Swelling of the abdomen that is new, acute  Fever of 100F or higher  For urgent or emergent issues, a gastroenterologist can be reached at any hour by calling [phone]. Do not use MyChart messaging for urgent concerns.    DIET:  We do recommend a small meal at first, but then you may proceed to your regular diet.  Drink plenty of fluids but you should avoid alcoholic beverages for 24  hours.  ACTIVITY:  You should plan to take it easy for the rest of today and you should NOT DRIVE or use heavy machinery until tomorrow (because of the sedation medicines used during the test).    FOLLOW UP: Our staff will call the number listed on your records the next business day following your procedure.  We will call around 7:15- 8:00 am to check on you and address any questions or concerns that you may have regarding the information given to you following your procedure. If we do not reach you, we will leave a message.     If any biopsies were taken you will be contacted by phone or by letter within the next 1-3 weeks.  Please call us at [phone] if you have not heard about the biopsies in 3 weeks.    SIGNATURES/CONFIDENTIALITY: You and/or your care partner have signed paperwork which will be entered into your electronic medical record.  These signatures attest to the fact that that the information above on your After Visit Summary has been reviewed and is understood.  Full responsibility of the confidentiality of this discharge information lies with you and/or your care-partner. 

## 2021-10-17 ENCOUNTER — Telehealth: Payer: Self-pay

## 2021-10-17 NOTE — Telephone Encounter (Signed)
  Follow up Call-     10/16/2021    9:15 AM  Call back number  Post procedure Call Back phone  # [phone]  Permission to leave phone message Yes     Patient questions:  Do you have a fever, pain , or abdominal swelling? No. Pain Score  0 *  Have you tolerated food without any problems? Yes.    Have you been able to return to your normal activities? Yes.    Do you have any questions about your discharge instructions: Diet   No. Medications  No. Follow up visit  No.  Do you have questions or concerns about your Care? No.  Actions: * If pain score is 4 or above: No action needed, pain <4.

## 2021-12-21 ENCOUNTER — Ambulatory Visit (INDEPENDENT_AMBULATORY_CARE_PROVIDER_SITE_OTHER): Payer: Managed Care, Other (non HMO) | Admitting: Adult Health

## 2021-12-21 ENCOUNTER — Encounter: Payer: Self-pay | Admitting: Adult Health

## 2021-12-21 VITALS — BP 120/88 | HR 76 | Temp 98.0°F | Ht 69.0 in | Wt 173.0 lb

## 2021-12-21 DIAGNOSIS — J0140 Acute pansinusitis, unspecified: Secondary | ICD-10-CM | POA: Diagnosis not present

## 2021-12-21 MED ORDER — DOXYCYCLINE HYCLATE 100 MG PO CAPS: 100.0000 mg | ORAL_CAPSULE | Freq: Two times a day (BID) | ORAL | 0 refills | Status: DC

## 2021-12-21 NOTE — Progress Notes (Signed)
Subjective:    Patient ID: Antonio Valdez, male    DOB: 05-01-1960, 61 y.o.   MRN: [id number]  Headache  Associated symptoms include coughing and sinus pressure. Pertinent negatives include no ear pain, sore throat or swollen glands.  Sinus Problem This is a new problem. The current episode started in the past 7 days. The problem has been gradually worsening since onset. There has been no fever. Associated symptoms include congestion, coughing, headaches and sinus pressure. Pertinent negatives include no chills, ear pain, shortness of breath, sore throat or swollen glands. Past treatments include nothing.      Review of Systems  Constitutional:  Negative for chills.  HENT:  Positive for congestion and sinus pressure. Negative for ear pain and sore throat.   Respiratory:  Positive for cough. Negative for shortness of breath.   Cardiovascular: Negative.   Musculoskeletal: Negative.   Neurological:  Positive for headaches.  Psychiatric/Behavioral: Negative.     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    Married with no kids. Married for 11 years.       Social Determinants of Health   Financial Resource Strain: Not on file  Food Insecurity: Not on file   Transportation Needs: Not on file  Physical Activity: Not on file  Stress: Not on file  Social Connections: Not on file  Intimate Partner Violence: Not on file    Past Surgical History:  Procedure Laterality Date   ADENOIDECTOMY     COLONOSCOPY     INGUINAL HERNIA REPAIR Left    LUMBAR SPINE SURGERY      Family History  Problem Relation Age of Onset   Arthritis Mother    Hyperlipidemia Mother    Colitis Mother    Renal cancer Father    Colon polyps Father    Kidney cancer Father    Heart attack Father    Colon polyps Brother    Hyperlipidemia Brother    Heart attack Maternal Grandmother    Heart attack Paternal Grandmother    Colon cancer Paternal Aunt    Esophageal cancer Neg Hx    Stomach cancer Neg Hx    Rectal cancer Neg Hx     No Known Allergies  Current Outpatient Medications on File Prior to Visit  Medication Sig Dispense Refill   Ascorbic Acid (VITAMIN C PO) Take by mouth.     Barberry-Oreg Grape-Goldenseal (BERBERINE COMPLEX PO) Take 1 capsule by mouth 2 (two) times daily.     Cholecalciferol (VITAMIN D-3) 5000 units TABS Take 1 tablet by mouth daily.     levothyroxine (SYNTHROID) 100 MCG tablet Take 100 mcg by mouth every  morning.     levothyroxine (SYNTHROID) 50 MCG tablet SMARTSIG:1 Tablet(s) By Mouth     Multiple Vitamin (MULTIVITAMIN) tablet Take 1 tablet by mouth daily.     Testosterone Undecanoate 200 MG CAPS Take 200 mg by mouth daily in the afternoon.     VITAMIN E PO Take by mouth.     No current facility-administered medications on file prior to visit.    BP 120/88   Pulse 76   Temp 98 F (36.7 C) (Oral)   Ht '5\' 9"'$  (1.753 m)   Wt 173 lb (78.5 kg)   SpO2 96%   BMI 25.55 kg/m       Objective:   Physical Exam Vitals and nursing note reviewed.  Constitutional:      Appearance: He is well-developed.  HENT:     Right Ear: Hearing, tympanic membrane, ear canal and external ear normal.     Left Ear: Hearing, tympanic membrane, ear  canal and external ear normal.     Nose: Congestion and rhinorrhea present. Rhinorrhea is purulent.     Right Turbinates: Enlarged and swollen.     Left Turbinates: Enlarged and swollen.     Right Sinus: Maxillary sinus tenderness and frontal sinus tenderness present.     Left Sinus: Maxillary sinus tenderness and frontal sinus tenderness present.  Cardiovascular:     Rate and Rhythm: Normal rate and regular rhythm.     Pulses: Normal pulses.     Heart sounds: Normal heart sounds.  Pulmonary:     Effort: Pulmonary effort is normal.     Breath sounds: Normal breath sounds.  Musculoskeletal:        General: Normal range of motion.  Skin:    General: Skin is warm and dry.     Capillary Refill: Capillary refill takes less than 2 seconds.  Neurological:     General: No focal deficit present.     Mental Status: He is alert and oriented to person, place, and time.        Assessment & Plan:  1. Acute non-recurrent pansinusitis - Rest, stay hydrated and can use normal saline spray. Will send in Doxycycline for him to start today  - Follow up if not resolving in the next 2-3 days  - doxycycline (VIBRAMYCIN) 100 MG capsule; Take 1 capsule (100 mg total) by mouth 2 (two) times daily.  Dispense: 14 capsule; Refill: 0  Dorothyann Peng, NP

## 2022-08-21 ENCOUNTER — Encounter (INDEPENDENT_AMBULATORY_CARE_PROVIDER_SITE_OTHER): Payer: Self-pay

## 2022-09-11 ENCOUNTER — Encounter: Payer: Self-pay | Admitting: Adult Health

## 2022-09-11 ENCOUNTER — Ambulatory Visit (INDEPENDENT_AMBULATORY_CARE_PROVIDER_SITE_OTHER): Payer: Managed Care, Other (non HMO) | Admitting: Adult Health

## 2022-09-11 VITALS — BP 120/82 | HR 88 | Temp 98.1°F | Ht 68.25 in | Wt 176.0 lb

## 2022-09-11 DIAGNOSIS — E039 Hypothyroidism, unspecified: Secondary | ICD-10-CM | POA: Diagnosis not present

## 2022-09-11 DIAGNOSIS — R7989 Other specified abnormal findings of blood chemistry: Secondary | ICD-10-CM | POA: Diagnosis not present

## 2022-09-11 DIAGNOSIS — J302 Other seasonal allergic rhinitis: Secondary | ICD-10-CM

## 2022-09-11 DIAGNOSIS — E782 Mixed hyperlipidemia: Secondary | ICD-10-CM

## 2022-09-11 DIAGNOSIS — E559 Vitamin D deficiency, unspecified: Secondary | ICD-10-CM | POA: Diagnosis not present

## 2022-09-11 DIAGNOSIS — Z125 Encounter for screening for malignant neoplasm of prostate: Secondary | ICD-10-CM | POA: Diagnosis not present

## 2022-09-11 DIAGNOSIS — Z0000 Encounter for general adult medical examination without abnormal findings: Secondary | ICD-10-CM | POA: Diagnosis not present

## 2022-09-11 LAB — COMPREHENSIVE METABOLIC PANEL
ALT: 20 U/L (ref 0–53)
AST: 19 U/L (ref 0–37)
Albumin: 4.3 g/dL (ref 3.5–5.2)
Alkaline Phosphatase: 52 U/L (ref 39–117)
BUN: 16 mg/dL (ref 6–23)
CO2: 29 meq/L (ref 19–32)
Calcium: 9.3 mg/dL (ref 8.4–10.5)
Chloride: 101 meq/L (ref 96–112)
Creatinine, Ser: 1.13 mg/dL (ref 0.40–1.50)
GFR: 69.41 mL/min (ref >60.00)
Glucose, Bld: 110 mg/dL — ABNORMAL HIGH (ref 70–99)
Potassium: 4.3 meq/L (ref 3.5–5.1)
Sodium: 137 meq/L (ref 135–145)
Total Bilirubin: 0.6 mg/dL (ref 0.2–1.2)
Total Protein: 6.8 g/dL (ref 6.0–8.3)

## 2022-09-11 LAB — CBC
HCT: 45.3 % (ref 39.0–52.0)
Hemoglobin: 15.3 g/dL (ref 13.0–17.0)
MCHC: 33.8 g/dL (ref 30.0–36.0)
MCV: 94.3 fl (ref 78.0–100.0)
Platelets: 249 10*3/uL (ref 150.0–400.0)
RBC: 4.81 Mil/uL (ref 4.22–5.81)
RDW: 12.9 % (ref 11.5–15.5)
WBC: 5.5 10*3/uL (ref 4.0–10.5)

## 2022-09-11 LAB — LIPID PANEL
Cholesterol: 199 mg/dL (ref 0–200)
HDL: 43.6 mg/dL (ref >39.00)
LDL Cholesterol: 137 mg/dL — ABNORMAL HIGH (ref 0–99)
NonHDL: 155.25
Total CHOL/HDL Ratio: 5
Triglycerides: 92 mg/dL (ref 0.0–149.0)
VLDL: 18.4 mg/dL (ref 0.0–40.0)

## 2022-09-11 NOTE — Progress Notes (Signed)
Subjective:    Patient ID: Antonio Valdez, male    DOB: [birth year], 62 y.o.   MRN: [id number]  HPI Patient presents for yearly preventative medicine examination. He is a pleasant 62 year old male who  has a past medical history of ALLERGIC RHINITIS CAUSE UNSPECIFIED (09/05/2009), HERPES LABIALIS (09/05/2009), HLD (hyperlipidemia), Hyperplastic colon polyp, and UNSPECIFIED TACHYCARDIA (09/05/2009).  Hyperlipidemia - untreated and asympomatic  He was seen by cardiology recently due to family history of heart disease.  In February 2022 he had a coronary calcium score which was 0. Lab Results  Component Value Date   CHOL 189 09/05/2021   HDL 40.40 09/05/2021   LDLCALC 134 (H) 09/05/2021   TRIG 71.0 09/05/2021   CHOLHDL 5 09/05/2021    Seasonal allergies - takes OTC claritin   Vitamin D Deficiency - takes Vitamin D 1000 units daily.  Last vitamin D Lab Results  Component Value Date   VD25OH 63.45 09/05/2021   Hypothyroidism -Managed with synthroid 100 mcg M/W/F and T/Th/Sat/Sun 50 mcg.  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   All immunizations and health maintenance protocols were reviewed with the patient and needed orders were placed.  Appropriate screening laboratory values were ordered for the patient including screening of hyperlipidemia, renal function and hepatic function. If indicated by BPH, a PSA was ordered.  Medication reconciliation,  past medical history, social history, problem list and allergies were reviewed in detail with the patient  Goals were established with regard to weight loss, exercise, and  diet in compliance with medications Wt Readings from Last 3 Encounters:  09/11/22 176 lb (79.8 kg)  12/21/21 173 lb (78.5 kg)  10/16/21 179 lb (81.2 kg)    Review of Systems  Constitutional: Negative.    HENT: Negative.    Eyes: Negative.   Respiratory: Negative.    Cardiovascular: Negative.   Gastrointestinal: Negative.   Endocrine: Negative.   Genitourinary: Negative.   Musculoskeletal: Negative.   Skin: Negative.   Allergic/Immunologic: Negative.   Neurological: Negative.   Hematological: Negative.   Psychiatric/Behavioral: Negative.    All other systems reviewed and are negative.  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    Married with no kids. Married for 11 years.       Social Determinants of Health   Financial Resource Strain: Not on file  Food Insecurity: Not on file  Transportation Needs: Not on file  Physical Activity: Not on file  Stress: Not on file  Social  Connections: Not on file  Intimate Partner Violence: Not on file    Past Surgical History:  Procedure Laterality Date   ADENOIDECTOMY     COLONOSCOPY     INGUINAL HERNIA REPAIR Left    LUMBAR SPINE SURGERY      Family History  Problem Relation Age of Onset   Arthritis Mother    Hyperlipidemia Mother    Colitis Mother    Renal cancer Father    Colon polyps Father    Kidney cancer Father    Heart attack Father    Colon polyps Brother    Hyperlipidemia Brother    Heart attack  Maternal Grandmother    Heart attack Paternal Grandmother    Colon cancer Paternal Aunt    Esophageal cancer Neg Hx    Stomach cancer Neg Hx    Rectal cancer Neg Hx     No Known Allergies  Current Outpatient Medications on File Prior to Visit  Medication Sig Dispense Refill   Ascorbic Acid (VITAMIN C PO) Take by mouth.     Barberry-Oreg Grape-Goldenseal (BERBERINE COMPLEX PO) Take 1 capsule by mouth 2 (two) times daily.     Cholecalciferol (VITAMIN D-3) 5000 units TABS Take 1 tablet by mouth daily.     levothyroxine (SYNTHROID) 100 MCG tablet Take 100 mcg by mouth every morning.     levothyroxine (SYNTHROID) 50 MCG tablet SMARTSIG:1 Tablet(s) By Mouth     Multiple Vitamin (MULTIVITAMIN) tablet Take 1 tablet by mouth daily.     Testosterone Undecanoate 200 MG CAPS Take 200 mg by mouth daily in the afternoon.     VITAMIN E PO Take by mouth.     No current facility-administered medications on file prior to visit.    BP 120/82   Pulse 88   Temp 98.1 F (36.7 C) (Oral)   Ht 5' 8.25" (1.734 m)   Wt 176 lb (79.8 kg)   SpO2 98%   BMI 26.57 kg/m       Objective:   Physical Exam Vitals and nursing note reviewed.  Constitutional:      General: He is not in acute distress.    Appearance: Normal appearance. He is not ill-appearing.  HENT:     Head: Normocephalic and atraumatic.     Right Ear: Tympanic membrane, ear canal and external ear normal. There is no impacted cerumen.     Left Ear: Tympanic membrane, ear canal and external ear normal. There is no impacted cerumen.     Nose: Nose normal. No congestion or rhinorrhea.     Mouth/Throat:     Mouth: Mucous membranes are moist.     Pharynx: Oropharynx is clear.  Eyes:     Extraocular Movements: Extraocular movements intact.     Conjunctiva/sclera: Conjunctivae normal.     Pupils: Pupils are equal, round, and reactive to light.  Neck:     Vascular: No carotid bruit.  Cardiovascular:     Rate and Rhythm: Normal rate and  regular rhythm.     Pulses: Normal pulses.     Heart sounds: No murmur heard.    No friction rub. No gallop.  Pulmonary:     Effort: Pulmonary effort is normal.     Breath sounds: Normal breath sounds.  Abdominal:     General: Abdomen is flat. Bowel sounds are normal. There is no distension.     Palpations: Abdomen is soft. There is no mass.     Tenderness: There is no abdominal tenderness. There is  no guarding or rebound.     Hernia: No hernia is present.  Musculoskeletal:        General: Normal range of motion.     Cervical back: Normal range of motion and neck supple.  Lymphadenopathy:     Cervical: No cervical adenopathy.  Skin:    General: Skin is warm and dry.     Capillary Refill: Capillary refill takes less than 2 seconds.  Neurological:     General: No focal deficit present.     Mental Status: He is alert and oriented to person, place, and time.  Psychiatric:        Mood and Affect: Mood normal.        Behavior: Behavior normal.        Thought Content: Thought content normal.        Judgment: Judgment normal.       Assessment & Plan:  1. Routine general medical examination at a health care facility Today patient counseled on age appropriate routine health concerns for screening and prevention, each reviewed and up to date or declined. Immunizations reviewed and up to date or declined. Labs ordered and reviewed. Risk factors for depression reviewed and negative. Hearing function and visual acuity are intact. ADLs screened and addressed as needed. Functional ability and level of safety reviewed and appropriate. Education, counseling and referrals performed based on assessed risks today. Patient provided with a copy of personalized plan for preventive services. - Follow up in one year or sooner if needed - Continue to eat healthy and exericse  2. Prostate cancer screening  - PSA; Future  3. Vitamin D deficiency - Consider dose change of Vitamin D  4. Mixed  hyperlipidemia - Will continue to monitor  - Lipid panel; Future - TSH; Future - CBC; Future - Comprehensive metabolic panel; Future - PSA; Future - VITAMIN D 25 Hydroxy (Vit-D Deficiency, Fractures); Future  5. Hypothyroidism, unspecified type - Per integrative medicine  - Lipid panel; Future - TSH; Future - CBC; Future - Comprehensive metabolic panel; Future - PSA; Future - T3, Free; Future - T4, Free; Future  6. Seasonal allergies - Continue OTC medication   7. Low testosterone in male - Per integrative medicine  - Testosterone; Future  Shirline Frees, NP

## 2022-09-13 LAB — TESTOSTERONE: Testosterone: 253.86 ng/dL — ABNORMAL LOW (ref 300.00–890.00)

## 2022-09-13 LAB — VITAMIN D 25 HYDROXY (VIT D DEFICIENCY, FRACTURES): VITD: 95.64 ng/mL (ref 30.00–100.00)

## 2022-09-13 LAB — TSH: TSH: 1.4 u[IU]/mL (ref 0.35–5.50)

## 2022-09-13 LAB — T3, FREE: T3, Free: 3.7 pg/mL (ref 2.3–4.2)

## 2022-09-13 LAB — PSA: PSA: 0.36 ng/mL (ref 0.10–4.00)

## 2022-09-13 LAB — T4, FREE: Free T4: 0.85 ng/dL (ref 0.60–1.60)

## 2022-10-21 ENCOUNTER — Telehealth: Payer: Self-pay | Admitting: Cardiovascular Disease

## 2022-10-21 NOTE — Progress Notes (Unsigned)
   Cardiology Clinic Note   Date: 10/21/2022 ID: Macintyre, Alexa 1960/02/25, MRN [id number]  Primary Cardiologist:  None  Patient Profile    CHINEDUM VANHOUTEN is a 62 y.o. male who presents to the clinic today for ***    Past medical history significant for: Hyperlipidemia.  CT cardiac scoring 02/24/2020: Calcium score 0.  Family history of heart disease.      History of Present Illness    MOE GRACA was first evaluated by Dr. Allyson Sabal on 02/09/2020 to establish care for family history of CAD as requested by Dr. Evelene Croon. Patient reported history of heart disease in father who had MI in his 37s. He was asymptomtic with no complaints of chest pain or shortness of breath. Coronary calcium scoring February 2022 showed calcium score of 0.   Patient was last seen in the office by Dr. Allyson Sabal on 09/05/2022 for routine follow up. He remained without complaints.   Patient contacted the office on 10/21/2022 with complaints of chest pain. Per triage RN: "Reports that on 2 separate occasions he had some chest tightness which went away after 5 minutes. These were both with activity. It was bad enough that he had to sit down and rest for a while. He is not currently having any chest pain or any other symptoms. Appointment made in office for 10/22/22 at 10:05 with Carlos Levering, NP. Given ER precautions. He verbalized understanding."  Today, patient ***  Chest pain. Patient ***   ROS: All other systems reviewed and are otherwise negative except as noted in History of Present Illness.  Studies Reviewed       ***  Risk Assessment/Calculations    {Does this patient have ATRIAL FIBRILLATION?:[phone]} No BP recorded.  {Refresh Note OR Click here to enter BP  :1}***        Physical Exam    VS:  There were no vitals taken for this visit. , BMI There is no height or weight on file to calculate BMI.  GEN: Well nourished, well developed, in no acute distress. Neck: No JVD or carotid  bruits. Cardiac: *** RRR. No murmurs. No rubs or gallops.   Respiratory:  Respirations regular and unlabored. Clear to auscultation without rales, wheezing or rhonchi. GI: Soft, nontender, nondistended. Extremities: Radials/DP/PT 2+ and equal bilaterally. No clubbing or cyanosis. No edema ***  Skin: Warm and dry, no rash. Neuro: Strength intact.  Assessment & Plan   ***  Disposition: ***     {Are you ordering a CV Procedure (e.g. stress test, cath, DCCV, TEE, etc)?   Press F2        :[id number]}   Signed, Etta Grandchild. Atom Solivan, DNP, NP-C

## 2022-10-21 NOTE — Telephone Encounter (Signed)
Reports that on 2 separate occasions he had some chest tightness which went away after 5 minutes. These were both with activity. It was bad enough that he had to sit down and rest for a while. He is not currently having any chest pain or any other symptoms.  Appointment made in office for 10/22/22 at 10:05 with Antonio Levering, NP. Given ER precautions. He verbalized understanding.

## 2022-10-21 NOTE — Telephone Encounter (Signed)
  Pt c/o of Chest Pain: STAT if active CP, including tightness, pressure, jaw pain, radiating pain to shoulder/upper arm/back, CP unrelieved by Nitro. Symptoms reported of SOB, nausea, vomiting, sweating.  1. Are you having CP right now?   No  2. Are you experiencing any other symptoms (ex. SOB, nausea, vomiting, sweating)?   Patient stated that within the last 4 months he has felt  like he has had to "catch his breath".  3. Is your CP continuous or coming and going?   Continuous  4. Have you taken Nitroglycerin?  No but patient stated he swallowed 4 baby aspirins  5. How long have you been experiencing CP?   Episode happened yesterday and also about a week ago  6. If NO CP at time of call then end call with telling Pt to call back or call 911 if Chest pain returns prior to return call from triage team.   Patient stated he was working outside and twice he felt a tightness in chest which went away after 5 minutes.

## 2022-10-22 ENCOUNTER — Ambulatory Visit: Payer: Managed Care, Other (non HMO) | Attending: Student | Admitting: Student

## 2022-10-22 ENCOUNTER — Encounter: Payer: Self-pay | Admitting: Student

## 2022-10-22 VITALS — BP 108/78 | HR 77 | Ht 70.0 in | Wt 175.4 lb

## 2022-10-22 DIAGNOSIS — Z8249 Family history of ischemic heart disease and other diseases of the circulatory system: Secondary | ICD-10-CM | POA: Diagnosis not present

## 2022-10-22 DIAGNOSIS — E782 Mixed hyperlipidemia: Secondary | ICD-10-CM | POA: Diagnosis not present

## 2022-10-22 DIAGNOSIS — R5383 Other fatigue: Secondary | ICD-10-CM

## 2022-10-22 DIAGNOSIS — R079 Chest pain, unspecified: Secondary | ICD-10-CM

## 2022-10-22 MED ORDER — IVABRADINE HCL 5 MG PO TABS
5.0000 mg | ORAL_TABLET | ORAL | Status: DC
Start: 2022-10-22 — End: 2022-10-25

## 2022-10-22 NOTE — Patient Instructions (Signed)
Medication Instructions:  Ivabradine 10 mg (2 tablets) 1-2 hours prior to CTA *If you need a refill on your cardiac medications before your next appointment, please call your pharmacy*   Lab Work: CBC and Bmet will be drawn today.l If you have labs (blood work) drawn today and your tests are completely normal, you will receive your results only by: MyChart Message (if you have MyChart) OR A paper copy in the mail If you have any lab test that is abnormal or we need to change your treatment, we will call you to review the results.   Testing/Procedures:   Your cardiac CT will be scheduled at one of the below locations:   [facility] [address] [place], [place] [address] [phone]  OR  [facility] [address] [place], [place] [address] [phone]  OR   [facility] [address] [place], [place] [address] [phone]  If scheduled at [facility], please arrive at the [facility] and Children's Entrance (Entrance C2) of [facility] 30 minutes prior to test start time. You can use the FREE valet parking offered at entrance C (encouraged to control the heart rate for the test)  Proceed to the [facility] Radiology Department (first floor) to check-in and test prep.  All radiology patients and guests should use entrance C2 at [facility], accessed from [facility], even though the hospital's physical address listed is [address].    If scheduled at [facility] or [facility], please arrive 15 mins early for check-in and test prep.  There is spacious parking and easy access to the radiology department from the [facility] entrance. Please enter here and check-in with the desk attendant.   Please follow these instructions carefully (unless otherwise directed):  An IV will be  required for this test and Nitroglycerin will be given.  Hold all erectile dysfunction medications at least 3 days (72 hrs) prior to test. (Ie viagra, cialis, sildenafil, tadalafil, etc)   On the Night Before the Test: Be sure to Drink plenty of water. Do not consume any caffeinated/decaffeinated beverages or chocolate 12 hours prior to your test. Do not take any antihistamines 12 hours prior to your test.  On the Day of the Test: Drink plenty of water until 1 hour prior to the test. Do not eat any food 1 hour prior to test. You may take your regular medications prior to the test.  Take Ivabradine ([place]) two hours prior to test.      After the Test: Drink plenty of water. After receiving IV contrast, you may experience a mild flushed feeling. This is normal. On occasion, you may experience a mild rash up to 24 hours after the test. This is not dangerous. If this occurs, you can take Benadryl 25 mg and increase your fluid intake. If you experience trouble breathing, this can be serious. If it is severe call 911 IMMEDIATELY. If it is mild, please call our office. If you take any of these medications: Glipizide/Metformin, Avandament, Glucavance, please do not take 48 hours after completing test unless otherwise instructed.  We will call to schedule your test 2-4 weeks out understanding that some insurance companies will need an authorization prior to the service being performed.   For more information and frequently asked questions, please visit our website : [email or web address]  For non-scheduling related questions, please contact the cardiac  imaging nurse navigator should you have any questions/concerns: Cardiac Imaging Nurse Navigators Direct Office Dial: [phone]   For scheduling needs, including cancellations and rescheduling, please call [place], [phone].    Follow-Up: At [facility], you and your health needs are our priority.  As part of our  continuing mission to provide you with exceptional heart care, we have created designated Provider Care Teams.  These Care Teams include your primary Cardiologist (physician) and Advanced Practice Providers (APPs -  Physician Assistants and Nurse Practitioners) who all work together to provide you with the care you need, when you need it.  We recommend signing up for the patient portal called "MyChart".  Sign up information is provided on this After Visit Summary.  MyChart is used to connect with patients for Virtual Visits (Telemedicine).  Patients are able to view lab/test results, encounter notes, upcoming appointments, etc.  Non-urgent messages can be sent to your provider as well.   To learn more about what you can do with MyChart, go to [email or web address].    Your next appointment:   6 week(s)  Provider:   Nanetta Batty, MD  or Any APP

## 2022-10-23 LAB — BASIC METABOLIC PANEL
BUN/Creatinine Ratio: 19 (ref 10–24)
BUN: 19 mg/dL (ref 8–27)
CO2: 28 mmol/L (ref 20–29)
Calcium: 9.6 mg/dL (ref 8.6–10.2)
Chloride: 103 mmol/L (ref 96–106)
Creatinine, Ser: 1.01 mg/dL (ref 0.76–1.27)
Glucose: 65 mg/dL — ABNORMAL LOW (ref 70–99)
Potassium: 4.9 mmol/L (ref 3.5–5.2)
Sodium: 140 mmol/L (ref 134–144)
eGFR: 84 mL/min/{1.73_m2} (ref >59)

## 2022-10-23 LAB — CBC
Hematocrit: 47.5 % (ref 37.5–51.0)
Hemoglobin: 15.6 g/dL (ref 13.0–17.7)
MCH: 31.7 pg (ref 26.6–33.0)
MCHC: 32.8 g/dL (ref 31.5–35.7)
MCV: 97 fL (ref 79–97)
Platelets: 256 10*3/uL (ref 150–450)
RBC: 4.92 x10E6/uL (ref 4.14–5.80)
RDW: 11.8 % (ref 11.6–15.4)
WBC: 4.6 10*3/uL (ref 3.4–10.8)

## 2022-10-25 ENCOUNTER — Telehealth (HOSPITAL_COMMUNITY): Payer: Self-pay | Admitting: *Deleted

## 2022-10-25 ENCOUNTER — Other Ambulatory Visit (HOSPITAL_COMMUNITY): Payer: Self-pay | Admitting: *Deleted

## 2022-10-25 MED ORDER — IVABRADINE HCL 5 MG PO TABS: 10.0000 mg | ORAL_TABLET | Freq: Once | ORAL | 0 refills | Status: AC

## 2022-10-25 NOTE — Telephone Encounter (Signed)
 Received call from patient regarding upcoming cardiac imaging study; pt verbalizes understanding of appt date/time, parking situation and where to check in, pre-test NPO status and medications ordered, and verified current allergies; name and call back number provided for further questions should they arise Johney Frame RN Navigator Cardiac Imaging [facility] [phone] office [phone] cell

## 2022-10-30 ENCOUNTER — Ambulatory Visit (HOSPITAL_COMMUNITY)
Admission: RE | Admit: 2022-10-30 | Discharge: 2022-10-30 | Disposition: A | Discharge status: Home / Self Care | Payer: Managed Care, Other (non HMO) | Source: Ambulatory Visit | Attending: Student | Admitting: Student

## 2022-10-30 DIAGNOSIS — R079 Chest pain, unspecified: Secondary | ICD-10-CM | POA: Insufficient documentation

## 2022-10-30 DIAGNOSIS — R072 Precordial pain: Secondary | ICD-10-CM

## 2022-10-30 MED ORDER — IOHEXOL 350 MG/ML SOLN
100.0000 mL | Freq: Once | INTRAVENOUS | Status: AC | PRN
  Administered 2022-10-30: 100 mL via INTRAVENOUS

## 2022-10-30 MED ORDER — NITROGLYCERIN 0.4 MG SL SUBL
SUBLINGUAL_TABLET | SUBLINGUAL | Status: AC
  Filled 2022-10-30: qty 2

## 2022-10-30 MED ORDER — NITROGLYCERIN 0.4 MG SL SUBL: 0.8000 mg | SUBLINGUAL_TABLET | Freq: Once | SUBLINGUAL | Status: DC

## 2022-11-20 ENCOUNTER — Ambulatory Visit: Payer: Managed Care, Other (non HMO) | Admitting: Physician Assistant

## 2022-11-25 NOTE — Progress Notes (Signed)
Cardiology Office Note:  .   Date:  12/02/2022  ID:  Glade Nurse, DOB [birth year], MRN [id number] PCP: Shirline Frees, NP  [facility] Providers Cardiologist:  Nanetta Batty, MD  History of Present Illness: .   Antonio Valdez is a 62 y.o. male with a past medical history of HLD, family history of heart disease. He is followed by Dr. Allyson Sabal and presents today for a 6 week follow up appointment   Per chart review, patient was referred to Dr. Allyson Sabal in 2022 to establish care given his family history of CAD. Underwent coronary calcium scoring in 02/2020 with a coronary calcium score of 0.  He was seen in clinic on 10/22/22, and he complained of chest pain. Underwent coronary CTA on 10/30/22 that showed a coronary calcium score of 0, no obstructive CAD (ostial RCA was not interpretable).   Today, patient presents to discuss recent coronary CTA and to discuss the need for statin therapy. They had recently undergone a coronary CTA, which revealed a coronary calcium score of zero and no evidence of disease in the blood vessels around the heart. However, their LDL cholesterol level was slightly elevated at 137. The patient's diet is primarily composed of chicken and fish, with minimal intake of red meat and animal fats. They also take omega-3 fatty acid supplements daily and consume fish at least twice a week. Despite these healthy dietary habits, the patient admits to a lack of regular physical activity.  The patient's mother had high cholesterol levels but never took any medication for it and did not develop heart disease. The patient also mentioned a slightly elevated A1C level, which was being monitored by their primary care physician and a functional integrative doctor. The patient expressed concerns about potential side effects of statins, particularly after hearing about others' negative experiences. They were considering the option of starting a low-dose statin, specifically Crestor, which was  suggested to be better tolerated.  The patient's decision was to hold off on starting statin therapy for the time being, opting to continue monitoring their cholesterol levels and making lifestyle modifications. They expressed a willingness to reconsider statin therapy if their cholesterol levels increased significantly in the future.  ROS: Denies chest pain, shortness of breath, ankle edema.   Studies Reviewed: .    Cardiac Studies & Procedures          CT SCANS  CT CORONARY MORPH W/CTA COR W/SCORE 10/30/2022  Addendum 11/22/2022  6:49 PM ADDENDUM REPORT: 11/22/2022 18:47  EXAM: OVER-READ INTERPRETATION  CT CHEST  The following report is an over-read performed by radiologist Dr. Curly Shores [facility] on 11/22/2022. This over-read does not include interpretation of cardiac or coronary anatomy or pathology. The coronary CTA interpretation by the cardiologist is attached.  COMPARISON:  02/24/2020.  FINDINGS: Cardiovascular:  See findings discussed in the body of the report.  Mediastinum/Nodes: No suspicious adenopathy identified. Imaged mediastinal structures are unremarkable.  Lungs/Pleura: There is dependent basilar subsegmental atelectasis. No pneumonia or pulmonary edema. No pleural effusion or pneumothorax.  Upper Abdomen: 1.5 cm cyst right lobe of the liver no acute abnormality.  Musculoskeletal: No chest wall abnormality. No acute osseous findings.  IMPRESSION: No acute extracardiac incidental findings identified.   Electronically Signed By: Layla Maw M.D. On: 11/22/2022 18:47  Narrative CLINICAL DATA:  89M with chest pain  EXAM: Cardiac/Coronary CTA  TECHNIQUE: The patient was scanned on a [employer].  FINDINGS: A 100 kV prospective scan was triggered in  the descending thoracic aorta at 111 HU's. Axial non-contrast 3 mm slices were carried out through the heart. The data set was analyzed on a dedicated  work station and scored using the Agatson method. Gantry rotation speed was 250 msecs and collimation was .6 mm. No beta blockade and 0.8 mg of sl NTG was given. The 3D data set was reconstructed in 5% intervals of the 35-75% of the R-R cycle. Phases were analyzed on a dedicated work station using MPR, MIP and VRT modes. The patient received 100 cc of contrast.  Coronary Arteries:  Normal coronary origin.  Right dominance.  RCA is a large dominant artery that gives rise to PDA and PLA. Noncalcified plaque in proximal RCA causes 0-24% stenosis  Left main is a large artery that gives rise to LAD and LCX arteries.  LAD is a large vessel that has no plaque.  LCX is a non-dominant artery that gives rise to one large OM1 branch. There is no plaque.  Other findings:  Left Ventricle: Normal size  Left Atrium: Normal size  Pulmonary Veins: Normal configuration  Right Ventricle: Normal size  Right Atrium: Mild enlargement  Cardiac valves: No calcifications  Thoracic aorta: Normal size  Pulmonary Arteries: Normal size  Systemic Veins: Normal drainage  Pericardium: Normal thickness  IMPRESSION: 1.  Coronary calcium score of 0.  2.  Normal coronary origin with right dominance.  3. Technically difficult study with step artifacts due to cardiac motion. While no obstructive CAD is seen in visualized coronaries, the ostial RCA is not interpretable due to step artifact  4. Noncalcified plaque in proximal RCA causes minimal (0-24%) stenosis  Electronically Signed: By: Epifanio Lesches M.D. On: 10/30/2022 15:26   CT SCANS  CT CARDIAC SCORING (SELF PAY ONLY) 02/24/2020  Addendum 02/24/2020  8:43 PM ADDENDUM REPORT: 02/24/2020 20:41  CLINICAL DATA:  Risk stratification  EXAM: Coronary Calcium Score  TECHNIQUE: The patient was scanned on a [employer] scanner. Axial non-contrast 3 mm slices were carried out through the heart. The data set was analyzed on a  dedicated work station and scored using the Agatson method.  FINDINGS: Non-cardiac: See separate report from [facility].  Ascending Aorta: Normal caliber  Pericardium: Normal  Coronary arteries: Normal origins.  IMPRESSION: Coronary calcium score of 0. This is a low risk study.   Electronically Signed By: Chrystie Nose M.D. On: 02/24/2020 20:41  Narrative EXAM: OVER-READ INTERPRETATION  CT CHEST  The following report is an over-read performed by radiologist Dr. Jeronimo Greaves of [facility] on 02/24/2020. This over-read does not include interpretation of cardiac or coronary anatomy or pathology. The calcium score interpretation by the cardiologist is attached.  COMPARISON:  07/30/2018 chest radiograph. No prior CT.  FINDINGS: Vascular: Normal aortic caliber.  Mediastinum/Nodes: No imaged thoracic adenopathy.  Lungs/Pleura: No pleural fluid.  Clear imaged lungs.  Upper Abdomen: Suspect a subcentimeter right hepatic lobe cyst. Normal imaged portions of the spleen, stomach.  Musculoskeletal: No acute osseous abnormality.  IMPRESSION: No acute findings in the imaged extracardiac chest.  Electronically Signed: By: Jeronimo Greaves M.D. On: 02/24/2020 11:03           Risk Assessment/Calculations:             Physical Exam:   VS:  BP 108/70 (BP Location: Left Arm, Patient Position: Sitting, Cuff Size: Normal)   Pulse 64   Ht 5\' 10"  (1.778 m)   Wt 177 lb (80.3 kg)   SpO2 98%  BMI 25.40 kg/m    Wt Readings from Last 3 Encounters:  12/02/22 177 lb (80.3 kg)  10/22/22 175 lb 6.4 oz (79.6 kg)  09/11/22 176 lb (79.8 kg)    GEN: Well nourished, well developed in no acute distress. Sitting comfortably in the chair  NECK: No JVD CARDIAC: RRR, no murmurs, rubs, gallops. Radial pulses 2+ bilaterally  RESPIRATORY:  Clear to auscultation without rales, wheezing or rhonchi. Normal work of breathing on room air  ABDOMEN: Soft, non-tender,  non-distended EXTREMITIES:  No edema in BLE; No deformity   ASSESSMENT AND PLAN: .    Chest pain  Family history of CAD  - Patient was seen in clinic on 10/1 and complained of 2 brief episodes (less than 5 minutes) of chest pain. EKG in clinic was without ischemic changes - Underwent coronary CTA on 10/30/22 that showed coronary calcium score of 0, no obstructive CAD  - Patient denies chest pain since CTA. Denies cardiac symptoms   - With coronary calcium score of 0, no need for aspirin or statin therapy   HLD  - Lipid panel from 08/2022 showed LDL 137 - After discussing side effects vs benefits of statin therapy, patient prefers to hold off on statin therapy for now. With his coronary calcium score of 0, I agree that this is reasonable  - Patient's diety consists of a lot of chicken and fish. He does not eat much red meat or heavy cream. Discussed increasing physical activity as tolerated. His PCP and a functional medicine doctor are both keeping an eye on his cholesterol. He is willing to discuss statin therapy in the future if his LDL becomes further elevated    Dispo: Follow up in 2 years with Dr. Allyson Sabal   Signed, Jonita Albee, PA-C

## 2022-12-02 ENCOUNTER — Encounter: Payer: Self-pay | Admitting: Cardiology

## 2022-12-02 ENCOUNTER — Ambulatory Visit: Payer: Managed Care, Other (non HMO) | Attending: Cardiology | Admitting: Cardiology

## 2022-12-02 VITALS — BP 108/70 | HR 64 | Ht 70.0 in | Wt 177.0 lb

## 2022-12-02 DIAGNOSIS — Z8249 Family history of ischemic heart disease and other diseases of the circulatory system: Secondary | ICD-10-CM

## 2022-12-02 DIAGNOSIS — E782 Mixed hyperlipidemia: Secondary | ICD-10-CM | POA: Diagnosis not present

## 2022-12-02 DIAGNOSIS — R079 Chest pain, unspecified: Secondary | ICD-10-CM

## 2022-12-02 NOTE — Patient Instructions (Signed)
Medication Instructions:  No changes *If you need a refill on your cardiac medications before your next appointment, please call your pharmacy*  Lab Work: No labs  Testing/Procedures: No testing  Follow-Up: At [facility], you and your health needs are our priority.  As part of our continuing mission to provide you with exceptional heart care, we have created designated Provider Care Teams.  These Care Teams include your primary Cardiologist (physician) and Advanced Practice Providers (APPs -  Physician Assistants and Nurse Practitioners) who all work together to provide you with the care you need, when you need it.  We recommend signing up for the patient portal called "MyChart".  Sign up information is provided on this After Visit Summary.  MyChart is used to connect with patients for Virtual Visits (Telemedicine).  Patients are able to view lab/test results, encounter notes, upcoming appointments, etc.  Non-urgent messages can be sent to your provider as well.   To learn more about what you can do with MyChart, go to [email or web address].    Your next appointment:   2 year(s)  Provider:   Nanetta Batty, MD

## 2022-12-26 ENCOUNTER — Ambulatory Visit: Payer: Managed Care, Other (non HMO) | Admitting: Adult Health

## 2022-12-26 ENCOUNTER — Encounter: Payer: Self-pay | Admitting: Adult Health

## 2022-12-26 VITALS — BP 130/80 | HR 75 | Temp 97.6°F | Ht 70.0 in | Wt 182.6 lb

## 2022-12-26 DIAGNOSIS — G479 Sleep disorder, unspecified: Secondary | ICD-10-CM

## 2022-12-26 DIAGNOSIS — E039 Hypothyroidism, unspecified: Secondary | ICD-10-CM

## 2022-12-26 DIAGNOSIS — R5383 Other fatigue: Secondary | ICD-10-CM

## 2022-12-26 NOTE — Progress Notes (Signed)
Subjective:    Patient ID: Antonio Valdez, male    DOB: 26-Jun-1960, 62 y.o.   MRN: [id number]  HPI  62 year old male who  has a past medical history of ALLERGIC RHINITIS CAUSE UNSPECIFIED (09/05/2009), HERPES LABIALIS (09/05/2009), HLD (hyperlipidemia), Hyperplastic colon polyp, and UNSPECIFIED TACHYCARDIA (09/05/2009).  He presents to the office today on the advice of his wife for the complaint of fatigue.  He reports that over the last few months he has been feeling like his energy level is low, he is getting interrupted sleep where he wakes up around 3 AM on a routine basis and feels as though he could sleep during the day.  He does not snore or have any apneic episodes.  He does see integrative medicine and has a history of low testosterone, he has tried testosterone injections in the past but felt as though this made his hair fall out, has also tried creams and also had a custom compounded testosterone pill done by his integrative medicine doctor.  Over the last 4 weeks he restarted his testosterone injections and has noticed that fatigue level has improved.  He has also been on Synthroid for many years for suspected hypothyroidism by integrative medicine and for the last month has been taking Cytomel due to a low T3 level.  Also reports that he has not been exercising like he has in the past and feels as though this is a contributing factor.  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    Married with no kids. Married for 11 years.       Social Determinants of Health   Financial Resource Strain: Not on file  Food Insecurity: Not on file  Transportation Needs: Not on file  Physical Activity: Not on file  Stress: Not on file  Social Connections: Not on file  Intimate Partner Violence: Not on file    Past Surgical History:  Procedure Laterality Date   ADENOIDECTOMY     COLONOSCOPY     INGUINAL HERNIA REPAIR Left    LUMBAR SPINE SURGERY      Family History  Problem Relation Age of Onset   Arthritis Mother    Hyperlipidemia Mother    Colitis Mother    Renal cancer Father    Colon polyps Father    Kidney cancer Father    Heart attack Father    Colon polyps Brother    Hyperlipidemia Brother    Heart attack Maternal Grandmother    Heart attack Paternal Grandmother    Colon cancer Paternal Aunt    Esophageal cancer Neg Hx  Stomach cancer Neg Hx    Rectal cancer Neg Hx     No Known Allergies  Current Outpatient Medications on File Prior to Visit  Medication Sig Dispense Refill   Ascorbic Acid (VITAMIN C PO) Take by mouth.     Barberry-Oreg Grape-Goldenseal (BERBERINE COMPLEX PO) Take 1 capsule by mouth 2 (two) times daily.     Cholecalciferol (VITAMIN D-3) 5000 units TABS Take 1 tablet by mouth daily.     finasteride (PROPECIA) 1 MG tablet Take 1 mg by mouth every other day.     levothyroxine (SYNTHROID) 100 MCG tablet Take 100 mcg by mouth every morning.     liothyronine (CYTOMEL) 5 MCG tablet Take 5 mcg by mouth every morning.     Multiple Vitamin (MULTIVITAMIN) tablet Take 1 tablet by mouth daily.     Multiple Vitamins-Minerals (ZINC PO) Take 1 capsule by mouth daily at 6 (six) AM.     Omega-3  Fatty Acids (FISH OIL PO) Take 1 capsule by mouth daily at 6 (six) AM.     QUERCETIN PO Take 1 capsule by mouth daily at 6 (six) AM.     testosterone cypionate (DEPOTESTOSTERONE CYPIONATE) 200 MG/ML injection Inject 200 mg into the muscle once a week.     Turmeric (QC TUMERIC COMPLEX PO) Take 1 capsule by mouth daily at 6 (six) AM.     VITAMIN E PO Take by mouth.     No current facility-administered medications on file prior to visit.    BP 130/80 (BP Location: Left Arm, Patient Position: Sitting, Cuff Size: Normal)   Pulse 75   Temp 97.6 F (36.4 C) (Oral)   Ht 5\' 10"  (1.778 m)   Wt 182 lb 9.6 oz (82.8 kg)   SpO2 100%   BMI 26.20 kg/m       Objective:   Physical Exam Vitals and nursing note reviewed.  Constitutional:      Appearance: Normal appearance.  Cardiovascular:     Rate and Rhythm: Normal rate and regular rhythm.     Pulses: Normal pulses.     Heart sounds: Normal heart sounds.  Pulmonary:     Effort: Pulmonary effort is normal.     Breath sounds: Normal breath sounds.  Musculoskeletal:        General: Normal range of motion.  Skin:    General: Skin is warm and dry.     Capillary Refill: Capillary refill takes less than 2 seconds.  Neurological:     General: No focal deficit present.     Mental Status: He is alert and oriented to person, place, and time.  Psychiatric:        Mood and Affect: Mood normal.        Behavior: Behavior normal.        Thought Content: Thought content normal.        Judgment: Judgment normal.            Assessment & Plan:  1. Other fatigue -I do not necessarily think he is depressed.  I would like him to start exercising on a routine basis and start taking melatonin before bedtime for the next couple weeks.  We also discussed stopping Cytomel and rechecking his thyroid labs iin 3 to 4 weeks.  -He is okay with the exercise regimen and taking melatonin but would like to think on stopping Cytomel.  -Will have him follow-up in  3 weeks  2. Sleep disorder  3. Hypothyroidism, unspecified type   Kandee Keen  Michele Kerlin, NP  Time spent with patient today was 43 minutes which consisted of chart review, discussing fatigue, sleep disorder, hypothyroidism work up, treatment answering questions and documentation.

## 2023-03-21 ENCOUNTER — Ambulatory Visit: Payer: Managed Care, Other (non HMO) | Admitting: Physician Assistant

## 2023-03-21 ENCOUNTER — Ambulatory Visit (HOSPITAL_BASED_OUTPATIENT_CLINIC_OR_DEPARTMENT_OTHER)
Admission: RE | Admit: 2023-03-21 | Discharge: 2023-03-21 | Disposition: A | Discharge status: Home / Self Care | Payer: Managed Care, Other (non HMO) | Source: Ambulatory Visit | Attending: Physician Assistant | Admitting: Physician Assistant

## 2023-03-21 ENCOUNTER — Ambulatory Visit: Payer: Self-pay | Admitting: Adult Health

## 2023-03-21 ENCOUNTER — Encounter: Payer: Self-pay | Admitting: Physician Assistant

## 2023-03-21 VITALS — BP 133/79 | HR 75 | Ht 70.0 in | Wt 185.2 lb

## 2023-03-21 DIAGNOSIS — M542 Cervicalgia: Secondary | ICD-10-CM | POA: Insufficient documentation

## 2023-03-21 MED ORDER — CYCLOBENZAPRINE HCL 5 MG PO TABS
5.0000 mg | ORAL_TABLET | Freq: Three times a day (TID) | ORAL | 0 refills | Status: DC | PRN
Start: 2023-03-21 — End: 2023-12-05

## 2023-03-21 NOTE — Telephone Encounter (Signed)
 Chief Complaint: Neck Pain  Symptoms: Left side neck pain with movement, numbness and tingling down the left arm, dull headache Frequency: Come and goes onset a few weeks ago Pertinent Negatives: Patient denies chest pain, blurred vision  Disposition: [] ED /[] Urgent Care (no appt availability in office) / [x] Appointment(In office/virtual)/ []  [facility]/ [] Home Care/ [] Refused Recommended Disposition /[]  Mobile Bus/ []  Follow-up with PCP Additional Notes: Patient states he has neck pain for about a week now. Patient states he hurts when he turns to the left. He noticed that he had numbness and tingling down the arm a few days ago. He also reports a dull headache that comes and goes. Care advice was given and patient was scheduled at [facility] today. Advised to callback if symptoms get worse.   Copied from CRM [id number]. Topic: Clinical - Red Word Triage >> Mar 21, 2023 10:18 AM Irine Seal wrote: [facility] that prompted transfer to Nurse Triage: Patient reports difficulty turning neck in one direction, patient stated it is also painful, with some numbness in one arm. Restricted movement has been present for several months, with numbness and tingling in the arm occurring for the past few weeks. Reason for Disposition  Numbness in an arm or hand (i.e., loss of sensation)  Answer Assessment - Initial Assessment Questions 1. ONSET: "When did the pain begin?"      About a week  2. LOCATION: "Where does it hurt?"      Left side of the neck  3. PATTERN "Does the pain come and go, or has it been constant since it started?"      Comes and goes  4. SEVERITY: "How bad is the pain?"  (Scale 1-10; or mild, moderate, severe)   - NO PAIN (0): no pain or only slight stiffness    - MILD (1-3): doesn't interfere with normal activities    - MODERATE (4-7): interferes with normal activities or awakens from sleep    - SEVERE (8-10):  excruciating pain, unable to do any normal activities       6/10 5. RADIATION: "Does the pain go anywhere else, shoot into your arms?"     Down the left arm  6. CORD SYMPTOMS: "Any weakness or numbness of the arms or legs?"     Numbness and tingling in the left arm  7. CAUSE: "What do you think is causing the neck pain?"     I'm not sure  8. NECK OVERUSE: "Any recent activities that involved turning or twisting the neck?"     No  9. OTHER SYMPTOMS: "Do you have any other symptoms?" (e.g., headache, fever, chest pain, difficulty breathing, neck swelling)     Dull headache  Protocols used: Neck Pain or Stiffness-A-AH

## 2023-03-21 NOTE — Progress Notes (Signed)
 Established patient visit   Patient: Antonio Valdez   DOB: 08-31-1960   63 y.o. Male  MRN: [id number] Visit Date: 03/21/2023  Today's healthcare provider: Alfredia Ferguson, PA-C   Cc. Neck pain x few mo  Subjective     Chronic L sided neck pain since late last year, comes and goes. Lately pain is worse, giving him some dull headaches, managing with advil, and a minor numbness and tingling sensation down his L arm.   Medications: Outpatient Medications Prior to Visit  Medication Sig   Ascorbic Acid (VITAMIN C PO) Take by mouth.   Barberry-Oreg Grape-Goldenseal (BERBERINE COMPLEX PO) Take 1 capsule by mouth 2 (two) times daily.   Cholecalciferol (VITAMIN D-3) 5000 units TABS Take 1 tablet by mouth daily.   finasteride (PROPECIA) 1 MG tablet Take 1 mg by mouth every other day.   levothyroxine (SYNTHROID) 100 MCG tablet Take 100 mcg by mouth every morning.   liothyronine (CYTOMEL) 5 MCG tablet Take 5 mcg by mouth every morning.   Multiple Vitamin (MULTIVITAMIN) tablet Take 1 tablet by mouth daily.   Multiple Vitamins-Minerals (ZINC PO) Take 1 capsule by mouth daily at 6 (six) AM.   Omega-3 Fatty Acids (FISH OIL PO) Take 1 capsule by mouth daily at 6 (six) AM.   QUERCETIN PO Take 1 capsule by mouth daily at 6 (six) AM.   testosterone cypionate (DEPOTESTOSTERONE CYPIONATE) 200 MG/ML injection Inject 200 mg into the muscle once a week.   Turmeric (QC TUMERIC COMPLEX PO) Take 1 capsule by mouth daily at 6 (six) AM.   VITAMIN E PO Take by mouth.   No facility-administered medications prior to visit.    Review of Systems  Constitutional:  Negative for fatigue and fever.  Respiratory:  Negative for cough and shortness of breath.   Cardiovascular:  Negative for chest pain, palpitations and leg swelling.  Musculoskeletal:  Positive for neck pain.  Neurological:  Negative for dizziness and headaches.       Objective    BP 133/79   Pulse 75   Ht 5\' 10"  (1.778 m)   Wt 185 lb 3.2  oz (84 kg)   BMI 26.57 kg/m    Physical Exam Vitals reviewed.  Constitutional:      Appearance: He is not ill-appearing.  HENT:     Head: Normocephalic.  Eyes:     Conjunctiva/sclera: Conjunctivae normal.  Cardiovascular:     Rate and Rhythm: Normal rate.  Pulmonary:     Effort: Pulmonary effort is normal. No respiratory distress.  Musculoskeletal:     Comments: Rull ROM neck -- some pain with left rotation .  Tightness to L cervical spinal muscles.  Neurological:     Mental Status: He is alert and oriented to person, place, and time.  Psychiatric:        Mood and Affect: Mood normal.        Behavior: Behavior normal.     No results found for any visits on 03/21/23.  Assessment & Plan    Neck pain -     Cyclobenzaprine HCl; Take 1 tablet (5 mg total) by mouth 3 (three) times daily as needed for muscle spasms.  Dispense: 30 tablet; Refill: 0 -     DG Cervical Spine Complete; Future  Recommending advil prn, rx cyclobenzaprine, topical heat.  Given some stretching exercises.  Given extended period of pain, paresthesias, ordering xray. If no improvement consider referral to PT  Return if symptoms worsen  or fail to improve.       Alfredia Ferguson, PA-C  [facility] [phone] (phone) [phone] (fax)  [facility]

## 2023-04-10 ENCOUNTER — Encounter: Payer: Self-pay | Admitting: Physician Assistant

## 2023-04-14 ENCOUNTER — Other Ambulatory Visit: Payer: Self-pay | Admitting: Physician Assistant

## 2023-04-14 DIAGNOSIS — M542 Cervicalgia: Secondary | ICD-10-CM

## 2023-11-05 ENCOUNTER — Telehealth: Payer: Self-pay

## 2023-11-05 NOTE — Telephone Encounter (Signed)
 Copied from CRM [id number]. Topic: Appointments - Scheduling Inquiry for Clinic >> Nov 03, 2023  4:15 PM Antonio Valdez wrote: Reason for CRM: Patient called in regarding labs for his annual appointment on 10/17 , would like a callback once order are in to be scheduled

## 2023-11-05 NOTE — Telephone Encounter (Signed)
 Called pt and wanted to get his labs before appt. I advised pt to reach pit to insurance company to see if they will approve it. Pt stated he will just wait on his appt. Date.

## 2023-11-07 ENCOUNTER — Encounter: Admitting: Adult Health

## 2023-12-05 ENCOUNTER — Ambulatory Visit: Payer: Self-pay | Admitting: Adult Health

## 2023-12-05 ENCOUNTER — Ambulatory Visit: Admitting: Adult Health

## 2023-12-05 ENCOUNTER — Encounter: Payer: Self-pay | Admitting: Adult Health

## 2023-12-05 VITALS — BP 120/80 | HR 74 | Temp 97.1°F | Ht 70.0 in | Wt 188.0 lb

## 2023-12-05 DIAGNOSIS — E559 Vitamin D deficiency, unspecified: Secondary | ICD-10-CM | POA: Diagnosis not present

## 2023-12-05 DIAGNOSIS — E039 Hypothyroidism, unspecified: Secondary | ICD-10-CM | POA: Diagnosis not present

## 2023-12-05 DIAGNOSIS — Z0000 Encounter for general adult medical examination without abnormal findings: Secondary | ICD-10-CM

## 2023-12-05 DIAGNOSIS — Z125 Encounter for screening for malignant neoplasm of prostate: Secondary | ICD-10-CM | POA: Diagnosis not present

## 2023-12-05 DIAGNOSIS — E782 Mixed hyperlipidemia: Secondary | ICD-10-CM | POA: Diagnosis not present

## 2023-12-05 DIAGNOSIS — L659 Nonscarring hair loss, unspecified: Secondary | ICD-10-CM

## 2023-12-05 DIAGNOSIS — E291 Testicular hypofunction: Secondary | ICD-10-CM

## 2023-12-05 LAB — CBC
HCT: 49.4 % (ref 39.0–52.0)
Hemoglobin: 16.7 g/dL (ref 13.0–17.0)
MCHC: 33.9 g/dL (ref 30.0–36.0)
MCV: 93.4 fl (ref 78.0–100.0)
Platelets: 266 K/uL (ref 150.0–400.0)
RBC: 5.28 Mil/uL (ref 4.22–5.81)
RDW: 12.9 % (ref 11.5–15.5)
WBC: 5.2 K/uL (ref 4.0–10.5)

## 2023-12-05 LAB — PSA: PSA: 1.2 ng/mL (ref 0.10–4.00)

## 2023-12-05 LAB — LIPID PANEL
Cholesterol: 181 mg/dL (ref 0–200)
HDL: 45.4 mg/dL (ref >39.00)
LDL Cholesterol: 121 mg/dL — ABNORMAL HIGH (ref 0–99)
NonHDL: 135.55
Total CHOL/HDL Ratio: 4
Triglycerides: 75 mg/dL (ref 0.0–149.0)
VLDL: 15 mg/dL (ref 0.0–40.0)

## 2023-12-05 LAB — COMPREHENSIVE METABOLIC PANEL WITH GFR
ALT: 31 U/L (ref 0–53)
AST: 21 U/L (ref 0–37)
Albumin: 4.3 g/dL (ref 3.5–5.2)
Alkaline Phosphatase: 60 U/L (ref 39–117)
BUN: 14 mg/dL (ref 6–23)
CO2: 29 meq/L (ref 19–32)
Calcium: 8.9 mg/dL (ref 8.4–10.5)
Chloride: 100 meq/L (ref 96–112)
Creatinine, Ser: 1.15 mg/dL (ref 0.40–1.50)
GFR: 67.38 mL/min (ref >60.00)
Glucose, Bld: 95 mg/dL (ref 70–99)
Potassium: 4.7 meq/L (ref 3.5–5.1)
Sodium: 135 meq/L (ref 135–145)
Total Bilirubin: 0.8 mg/dL (ref 0.2–1.2)
Total Protein: 6.4 g/dL (ref 6.0–8.3)

## 2023-12-05 LAB — TSH: TSH: 0.33 u[IU]/mL — ABNORMAL LOW (ref 0.35–5.50)

## 2023-12-05 LAB — VITAMIN D 25 HYDROXY (VIT D DEFICIENCY, FRACTURES): VITD: 58.83 ng/mL (ref 30.00–100.00)

## 2023-12-05 NOTE — Progress Notes (Signed)
 Subjective:    Patient ID: Antonio Valdez, male    DOB: 06/19/1960, 63 y.o.   MRN: [id number]  HPI Patient presents for yearly preventative medicine examination. He is a pleasant 63 year old male who  has a past medical history of ALLERGIC RHINITIS CAUSE UNSPECIFIED (09/05/2009), HERPES LABIALIS (09/05/2009), HLD (hyperlipidemia), Hyperplastic colon polyp, and UNSPECIFIED TACHYCARDIA (09/05/2009).  Hyperlipidemia - untreated and asympomatic  He was seen by cardiology recently due to family history of heart disease.  In February 2022 he had a coronary calcium  score which was 0. Lab Results  Component Value Date   CHOL 199 09/11/2022   HDL 43.60 09/11/2022   LDLCALC 137 (H) 09/11/2022   TRIG 92.0 09/11/2022   CHOLHDL 5 09/11/2022   Vitamin D  Deficiency - takes Vitamin D  5000 units daily.  Last vitamin D  Lab Results  Component Value Date   VD25OH 95.64 09/11/2022   Hypothyroidism -Managed with synthroid 100 mcg M/W/F and T/Th/Sat/Sun 50 mcg.  He is seen at a integrative medicine practice for this Lab Results  Component Value Date   TSH 1.40 09/11/2022   Low Testosterone - Is doing testosterone  injections through integrative medicine. '  Alopecia - started 1 mg Propecia about three months ago. This was started by [facility]    All immunizations and health maintenance protocols were reviewed with the patient and needed orders were placed. Refuses vaccinations today   Appropriate screening laboratory values were ordered for the patient including screening of hyperlipidemia, renal function and hepatic function. If indicated by BPH, a PSA was ordered.  Medication reconciliation,  past medical history, social history, problem list and allergies were reviewed in detail with the patient  Goals were established with regard to weight loss, exercise, and  diet in compliance with medications. He is staying active with exercise and eats healthy.   Wt Readings from Last 3  Encounters:  12/05/23 188 lb (85.3 kg)  03/21/23 185 lb 3.2 oz (84 kg)  12/26/22 182 lb 9.6 oz (82.8 kg)     Review of Systems  Constitutional: Negative.   HENT: Negative.    Eyes: Negative.   Respiratory: Negative.    Cardiovascular: Negative.   Gastrointestinal: Negative.   Endocrine: Negative.   Genitourinary: Negative.   Musculoskeletal: Negative.   Skin: Negative.   Allergic/Immunologic: Negative.   Neurological: Negative.   Hematological: Negative.   Psychiatric/Behavioral: Negative.    All other systems reviewed and are negative.  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    Married with no kids. Married for 11 years.       Social Drivers of Corporate Investment Banker  Strain: Not on file  Food Insecurity: Not on file  Transportation Needs: Not on file  Physical Activity: Not on file  Stress: Not on file  Social Connections: Not on file  Intimate Partner Violence: Not on file    Past Surgical History:  Procedure Laterality Date   ADENOIDECTOMY     COLONOSCOPY     INGUINAL HERNIA REPAIR Left    LUMBAR SPINE SURGERY      Family History  Problem Relation Age of Onset   Arthritis Mother    Hyperlipidemia Mother    Colitis Mother     Renal cancer Father    Colon polyps Father    Kidney cancer Father    Heart attack Father    Colon polyps Brother    Hyperlipidemia Brother    Heart attack Maternal Grandmother    Heart attack Paternal Grandmother    Colon cancer Paternal Aunt    Esophageal cancer Neg Hx    Stomach cancer Neg Hx    Rectal cancer Neg Hx     No Known Allergies  Current Outpatient Medications on File Prior to Visit  Medication Sig Dispense Refill   Ascorbic Acid (VITAMIN C PO) Take by mouth.     Barberry-Oreg Grape-Goldenseal (BERBERINE COMPLEX PO) Take 1 capsule by mouth 2 (two) times daily.     Cholecalciferol (VITAMIN D -3) 5000 units TABS Take 1 tablet by mouth daily.     finasteride (PROPECIA) 1 MG tablet Take 1 mg by mouth every other day.     Fluorouracil-Calcipotriene 5-0.005 % CREA Apply a small amount on face twice a day 4-5 days AS DIRECTED     levothyroxine (SYNTHROID) 100 MCG tablet Take 100 mcg by mouth every morning.     liothyronine (CYTOMEL) 5 MCG tablet Take 5 mcg by mouth every morning.     minoxidil (LONITEN) 2.5 MG tablet Take 2.5 mg by mouth daily.     Multiple Vitamins-Minerals (ZINC PO) Take 1 capsule by mouth daily at 6 (six) AM.     Omega-3 Fatty Acids (FISH OIL PO) Take 1 capsule by mouth daily at 6 (six) AM.     QUERCETIN PO Take 1 capsule by mouth daily at 6 (six) AM.     testosterone  cypionate (DEPOTESTOSTERONE CYPIONATE) 200 MG/ML injection Inject 200 mg into the muscle once a week.     Turmeric (QC TUMERIC COMPLEX PO) Take 1 capsule by mouth daily at 6 (six) AM.     No current facility-administered medications on file prior to visit.    BP 120/80   Pulse 74   Temp (!) 97.1 F (36.2 C) (Oral)   Ht 5' 10 (1.778 m)   Wt 188 lb (85.3 kg)   SpO2 97%   BMI 26.98 kg/m       Objective:   Physical Exam Vitals and nursing note reviewed.  Constitutional:      General: He is not in acute distress.    Appearance: Normal appearance. He is not ill-appearing.  HENT:      Head: Normocephalic and atraumatic.     Right Ear: Tympanic membrane, ear canal and external ear normal. There is no impacted cerumen.     Left Ear: Tympanic membrane, ear canal and external ear normal. There is no impacted cerumen.     Nose: Nose normal. No congestion or rhinorrhea.     Mouth/Throat:     Mouth: Mucous membranes are moist.     Pharynx: Oropharynx is clear.  Eyes:  Extraocular Movements: Extraocular movements intact.     Conjunctiva/sclera: Conjunctivae normal.     Pupils: Pupils are equal, round, and reactive to light.  Neck:     Vascular: No carotid bruit.  Cardiovascular:     Rate and Rhythm: Normal rate and regular rhythm.     Pulses: Normal pulses.     Heart sounds: No murmur heard.    No friction rub. No gallop.  Pulmonary:     Effort: Pulmonary effort is normal.     Breath sounds: Normal breath sounds.  Abdominal:     General: Abdomen is flat. Bowel sounds are normal. There is no distension.     Palpations: Abdomen is soft. There is no mass.     Tenderness: There is no abdominal tenderness. There is no guarding or rebound.     Hernia: No hernia is present.  Musculoskeletal:        General: Normal range of motion.     Cervical back: Normal range of motion and neck supple.  Lymphadenopathy:     Cervical: No cervical adenopathy.  Skin:    General: Skin is warm and dry.     Capillary Refill: Capillary refill takes less than 2 seconds.  Neurological:     General: No focal deficit present.     Mental Status: He is alert and oriented to person, place, and time.  Psychiatric:        Mood and Affect: Mood normal.        Behavior: Behavior normal.        Thought Content: Thought content normal.        Judgment: Judgment normal.        Assessment & Plan:  1. Routine general medical examination at a health care facility (Primary) Today patient counseled on age appropriate routine health concerns for screening and prevention, each reviewed and up to  date or declined. Immunizations reviewed and up to date or declined. Labs ordered and reviewed. Risk factors for depression reviewed and negative. Hearing function and visual acuity are intact. ADLs screened and addressed as needed. Functional ability and level of safety reviewed and appropriate. Education, counseling and referrals performed based on assessed risks today. Patient provided with a copy of personalized plan for preventive services. - Follow up I one year or sooner if needed - Stay active and eat healthy   2. Mixed hyperlipidemia - Consider statin  - Lipid panel; Future - TSH; Future - CBC; Future - Comprehensive metabolic panel with GFR; Future  3. Vitamin D  deficiency  - VITAMIN D  25 Hydroxy (Vit-D Deficiency, Fractures); Future  4. Hypothyroidism, unspecified type - Per integrative medicine provider  - Lipid panel; Future - TSH; Future - CBC; Future - Comprehensive metabolic panel with GFR; Future  5. Hypogonadism in male - Per integrative medicine provider  - Lipid panel; Future - TSH; Future - CBC; Future - Comprehensive metabolic panel with GFR; Future  6. Prostate cancer screening  - PSA; Future  7. Alopecia - Per integrative medicine provider  - Will check PSA today   Darleene Shape, NP
